# Patient Record
Sex: Female | Born: 1965 | Race: Black or African American | Hispanic: No | Marital: Single | Smoking: Never smoker
Health system: Southern US, Community
[De-identification: ages and names within clinical notes are randomized; demographics above are authoritative.]

## PROBLEM LIST (undated history)

## (undated) DIAGNOSIS — G43909 Migraine, unspecified, not intractable, without status migrainosus: Secondary | ICD-10-CM

## (undated) DIAGNOSIS — E7800 Pure hypercholesterolemia, unspecified: Secondary | ICD-10-CM

## (undated) DIAGNOSIS — F329 Major depressive disorder, single episode, unspecified: Secondary | ICD-10-CM

## (undated) DIAGNOSIS — J45909 Unspecified asthma, uncomplicated: Secondary | ICD-10-CM

## (undated) DIAGNOSIS — I341 Nonrheumatic mitral (valve) prolapse: Secondary | ICD-10-CM

## (undated) DIAGNOSIS — J302 Other seasonal allergic rhinitis: Secondary | ICD-10-CM

## (undated) DIAGNOSIS — F419 Anxiety disorder, unspecified: Secondary | ICD-10-CM

## (undated) DIAGNOSIS — I10 Essential (primary) hypertension: Secondary | ICD-10-CM

## (undated) DIAGNOSIS — E119 Type 2 diabetes mellitus without complications: Secondary | ICD-10-CM

## (undated) DIAGNOSIS — D573 Sickle-cell trait: Secondary | ICD-10-CM

## (undated) DIAGNOSIS — F32A Depression, unspecified: Secondary | ICD-10-CM

## (undated) HISTORY — DX: Sickle-cell trait: D57.3

## (undated) HISTORY — PX: HAMMER TOE SURGERY: SHX385

## (undated) HISTORY — DX: Anxiety disorder, unspecified: F41.9

## (undated) HISTORY — DX: Pure hypercholesterolemia, unspecified: E78.00

## (undated) HISTORY — PX: TUBAL LIGATION: SHX77

## (undated) HISTORY — PX: EYE SURGERY: SHX253

## (undated) HISTORY — DX: Unspecified asthma, uncomplicated: J45.909

## (undated) HISTORY — DX: Other seasonal allergic rhinitis: J30.2

## (undated) HISTORY — DX: Major depressive disorder, single episode, unspecified: F32.9

## (undated) HISTORY — DX: Essential (primary) hypertension: I10

## (undated) HISTORY — DX: Migraine, unspecified, not intractable, without status migrainosus: G43.909

## (undated) HISTORY — PX: TONSILLECTOMY: SUR1361

## (undated) HISTORY — PX: OVARIAN CYST REMOVAL: SHX89

## (undated) HISTORY — DX: Nonrheumatic mitral (valve) prolapse: I34.1

## (undated) HISTORY — DX: Type 2 diabetes mellitus without complications: E11.9

## (undated) HISTORY — DX: Depression, unspecified: F32.A

---

## 1993-12-09 HISTORY — PX: OTHER SURGICAL HISTORY: SHX169

## 1999-10-04 ENCOUNTER — Ambulatory Visit (HOSPITAL_BASED_OUTPATIENT_CLINIC_OR_DEPARTMENT_OTHER): Admission: RE | Admit: 1999-10-04 | Discharge: 1999-10-04 | Payer: Self-pay | Admitting: Plastic Surgery

## 2000-04-23 ENCOUNTER — Encounter: Admission: RE | Admit: 2000-04-23 | Discharge: 2000-04-23 | Payer: Self-pay | Admitting: Family Medicine

## 2000-04-23 ENCOUNTER — Encounter: Payer: Self-pay | Admitting: Family Medicine

## 2003-04-08 ENCOUNTER — Other Ambulatory Visit: Admission: RE | Admit: 2003-04-08 | Discharge: 2003-04-08 | Payer: Self-pay | Admitting: Family Medicine

## 2003-04-28 ENCOUNTER — Other Ambulatory Visit: Admission: RE | Admit: 2003-04-28 | Discharge: 2003-04-28 | Payer: Self-pay | Admitting: Obstetrics and Gynecology

## 2004-04-17 ENCOUNTER — Other Ambulatory Visit: Admission: RE | Admit: 2004-04-17 | Discharge: 2004-04-17 | Payer: Self-pay | Admitting: Family Medicine

## 2005-04-17 ENCOUNTER — Other Ambulatory Visit: Admission: RE | Admit: 2005-04-17 | Discharge: 2005-04-17 | Payer: Self-pay | Admitting: Family Medicine

## 2005-04-17 ENCOUNTER — Ambulatory Visit: Payer: Self-pay | Admitting: Family Medicine

## 2005-07-02 ENCOUNTER — Ambulatory Visit: Payer: Self-pay | Admitting: Internal Medicine

## 2006-06-10 ENCOUNTER — Other Ambulatory Visit: Admission: RE | Admit: 2006-06-10 | Discharge: 2006-06-10 | Payer: Self-pay | Admitting: Internal Medicine

## 2006-06-10 ENCOUNTER — Ambulatory Visit: Payer: Self-pay | Admitting: Internal Medicine

## 2006-06-10 ENCOUNTER — Encounter: Payer: Self-pay | Admitting: Internal Medicine

## 2006-12-01 ENCOUNTER — Ambulatory Visit: Payer: Self-pay | Admitting: Internal Medicine

## 2006-12-01 LAB — CONVERTED CEMR LAB
Chol/HDL Ratio, serum: 4.5
Cholesterol: 166 mg/dL (ref 0–200)
HCT: 33.9 % — ABNORMAL LOW (ref 36.0–46.0)
HDL: 37.1 mg/dL — ABNORMAL LOW (ref >39.0)
Hemoglobin: 11.2 g/dL — ABNORMAL LOW (ref 12.0–15.0)
LDL Cholesterol: 107 mg/dL — ABNORMAL HIGH (ref 0–99)
MCHC: 32.9 g/dL (ref 30.0–36.0)
MCV: 81.6 fL (ref 78.0–100.0)
Platelets: 313 10*3/uL (ref 150–400)
RBC: 4.16 M/uL (ref 3.87–5.11)
RDW: 16.1 % — ABNORMAL HIGH (ref 11.5–14.6)
Triglyceride fasting, serum: 108 mg/dL (ref 0–149)
VLDL: 22 mg/dL (ref 0–40)
WBC: 4.4 10*3/uL — ABNORMAL LOW (ref 4.5–10.5)

## 2007-05-18 ENCOUNTER — Telehealth: Payer: Self-pay | Admitting: Internal Medicine

## 2007-05-26 ENCOUNTER — Telehealth: Payer: Self-pay | Admitting: Internal Medicine

## 2007-05-29 ENCOUNTER — Encounter: Payer: Self-pay | Admitting: Internal Medicine

## 2007-06-01 ENCOUNTER — Encounter: Payer: Self-pay | Admitting: Internal Medicine

## 2007-06-24 ENCOUNTER — Encounter: Payer: Self-pay | Admitting: Internal Medicine

## 2007-06-24 ENCOUNTER — Other Ambulatory Visit: Admission: RE | Admit: 2007-06-24 | Discharge: 2007-06-24 | Payer: Self-pay | Admitting: Internal Medicine

## 2007-06-24 ENCOUNTER — Ambulatory Visit: Payer: Self-pay | Admitting: Internal Medicine

## 2007-06-24 DIAGNOSIS — S0280XA Fracture of other specified skull and facial bones, unspecified side, initial encounter for closed fracture: Secondary | ICD-10-CM

## 2007-06-24 DIAGNOSIS — G43009 Migraine without aura, not intractable, without status migrainosus: Secondary | ICD-10-CM

## 2007-06-24 DIAGNOSIS — D573 Sickle-cell trait: Secondary | ICD-10-CM

## 2007-06-24 DIAGNOSIS — E7210 Disorders of sulfur-bearing amino-acid metabolism, unspecified: Secondary | ICD-10-CM | POA: Insufficient documentation

## 2007-06-27 LAB — CONVERTED CEMR LAB
Folate: 15.4 ng/mL
Hemoglobin: 12.6 g/dL (ref 12.0–15.0)
Homocysteine: 8.3 micromoles/L (ref 5.00–13.90)
Iron: 169 ug/dL — ABNORMAL HIGH (ref 42–145)
Transferrin: 277.5 mg/dL (ref >=212.0)

## 2007-06-29 ENCOUNTER — Encounter (INDEPENDENT_AMBULATORY_CARE_PROVIDER_SITE_OTHER): Payer: Self-pay | Admitting: *Deleted

## 2008-06-17 ENCOUNTER — Encounter: Payer: Self-pay | Admitting: Internal Medicine

## 2008-06-27 ENCOUNTER — Encounter (INDEPENDENT_AMBULATORY_CARE_PROVIDER_SITE_OTHER): Payer: Self-pay | Admitting: *Deleted

## 2011-12-16 ENCOUNTER — Ambulatory Visit (INDEPENDENT_AMBULATORY_CARE_PROVIDER_SITE_OTHER): Payer: BC Managed Care – PPO

## 2011-12-16 DIAGNOSIS — N390 Urinary tract infection, site not specified: Secondary | ICD-10-CM

## 2012-01-10 ENCOUNTER — Other Ambulatory Visit: Payer: Self-pay | Admitting: Family Medicine

## 2012-01-16 ENCOUNTER — Other Ambulatory Visit: Payer: Self-pay | Admitting: Family Medicine

## 2012-01-17 ENCOUNTER — Telehealth: Payer: Self-pay

## 2012-01-17 NOTE — Telephone Encounter (Signed)
PT LOOKING FOR CLONOPIN MED REFILL FOR [facility]

## 2012-01-19 MED ORDER — CLONAZEPAM 0.5 MG PO TABS: ORAL_TABLET | ORAL | Status: DC

## 2012-01-19 NOTE — Telephone Encounter (Signed)
Please call in to pharmacy.  Please notify patient. csj

## 2012-01-19 NOTE — Telephone Encounter (Signed)
RX CALLED INTO PHARMACY AND LMOM NOTIFYING PT 

## 2012-02-28 ENCOUNTER — Other Ambulatory Visit: Payer: Self-pay | Admitting: Physician Assistant

## 2012-04-27 ENCOUNTER — Other Ambulatory Visit: Payer: Self-pay

## 2012-04-27 MED ORDER — CLONAZEPAM 0.5 MG PO TABS: ORAL_TABLET | ORAL | Status: DC

## 2012-06-19 ENCOUNTER — Other Ambulatory Visit: Payer: Self-pay | Admitting: Physician Assistant

## 2012-06-21 ENCOUNTER — Other Ambulatory Visit: Payer: Self-pay

## 2012-08-10 ENCOUNTER — Ambulatory Visit (INDEPENDENT_AMBULATORY_CARE_PROVIDER_SITE_OTHER): Payer: BC Managed Care – PPO | Admitting: Physician Assistant

## 2012-08-10 VITALS — BP 113/75 | HR 82 | Temp 98.2°F | Resp 18 | Ht 70.0 in | Wt 197.8 lb

## 2012-08-10 DIAGNOSIS — F419 Anxiety disorder, unspecified: Secondary | ICD-10-CM

## 2012-08-10 DIAGNOSIS — F411 Generalized anxiety disorder: Secondary | ICD-10-CM

## 2012-08-10 DIAGNOSIS — Z0000 Encounter for general adult medical examination without abnormal findings: Secondary | ICD-10-CM

## 2012-08-10 LAB — COMPREHENSIVE METABOLIC PANEL
ALT: 12 U/L (ref 0–35)
AST: 16 U/L (ref 0–37)
Albumin: 4.7 g/dL (ref 3.5–5.2)
Alkaline Phosphatase: 39 U/L (ref 39–117)
BUN: 8 mg/dL (ref 6–23)
CO2: 24 mEq/L (ref 19–32)
Calcium: 9.6 mg/dL (ref 8.4–10.5)
Chloride: 107 mEq/L (ref 96–112)
Creat: 0.76 mg/dL (ref 0.50–1.10)
Glucose, Bld: 94 mg/dL (ref 70–99)
Potassium: 4.4 mEq/L (ref 3.5–5.3)
Sodium: 139 mEq/L (ref 135–145)
Total Bilirubin: 0.5 mg/dL (ref 0.3–1.2)
Total Protein: 7.6 g/dL (ref 6.0–8.3)

## 2012-08-10 LAB — POCT UA - MICROSCOPIC ONLY
Bacteria, U Microscopic: NEGATIVE
Casts, Ur, LPF, POC: NEGATIVE
Crystals, Ur, HPF, POC: NEGATIVE
Yeast, UA: NEGATIVE

## 2012-08-10 LAB — POCT URINALYSIS DIPSTICK
Bilirubin, UA: NEGATIVE
Blood, UA: NEGATIVE
Glucose, UA: NEGATIVE
Ketones, UA: NEGATIVE
Leukocytes, UA: NEGATIVE
Nitrite, UA: NEGATIVE
Protein, UA: NEGATIVE
Spec Grav, UA: 1.02
Urobilinogen, UA: 0.2
pH, UA: 5

## 2012-08-10 LAB — POCT WET PREP WITH KOH
KOH Prep POC: NEGATIVE
RBC Wet Prep HPF POC: NEGATIVE
Trichomonas, UA: NEGATIVE
WBC Wet Prep HPF POC: NEGATIVE
Yeast Wet Prep HPF POC: NEGATIVE

## 2012-08-10 LAB — LIPID PANEL
Cholesterol: 163 mg/dL (ref 0–200)
HDL: 29 mg/dL — ABNORMAL LOW (ref >39)
LDL Cholesterol: 92 mg/dL (ref 0–99)
Total CHOL/HDL Ratio: 5.6 Ratio
Triglycerides: 208 mg/dL — ABNORMAL HIGH (ref <150)
VLDL: 42 mg/dL — ABNORMAL HIGH (ref 0–40)

## 2012-08-10 LAB — POCT CBC
Granulocyte percent: 63 %G (ref 37–80)
HCT, POC: 38.6 % (ref 37.7–47.9)
Hemoglobin: 11.9 g/dL — AB (ref 12.2–16.2)
Lymph, poc: 1.6 (ref 0.6–3.4)
MCH, POC: 25.9 pg — AB (ref 27–31.2)
MCHC: 30.8 g/dL — AB (ref 31.8–35.4)
MCV: 84.2 fL (ref 80–97)
MID (cbc): 0.4 (ref 0–0.9)
MPV: 7.7 fL (ref 0–99.8)
POC Granulocyte: 3.4 (ref 2–6.9)
POC LYMPH PERCENT: 29 %L (ref 10–50)
POC MID %: 8 %M (ref 0–12)
Platelet Count, POC: 345 10*3/uL (ref 142–424)
RBC: 4.59 M/uL (ref 4.04–5.48)
RDW, POC: 14.7 %
WBC: 5.4 10*3/uL (ref 4.6–10.2)

## 2012-08-10 LAB — TSH: TSH: 0.777 u[IU]/mL (ref 0.350–4.500)

## 2012-08-10 MED ORDER — CLONAZEPAM 0.5 MG PO TABS: 0.2500 mg | ORAL_TABLET | Freq: Two times a day (BID) | ORAL | Status: DC | PRN

## 2012-08-10 NOTE — Progress Notes (Signed)
  Subjective:    Patient ID: Kendra Saunders, female    DOB: January 02, 1966, 46 y.o.   MRN: [id number]  HPI 46 year old female presents for complete physical. Her last physical was 1 year ago. She has no known medical problems and is doing well. Does need a refill on her clonazepam.  Does complain of some urinary frequency x 2 days. States she is prone to UTI's and wants to be checked today. Also complains of frequent yeast infections but denies any specific symptoms today. Had mammogram 06/2012 that was normal.     Review of Systems  All other systems reviewed and are negative.       Objective:   Physical Exam  Vitals reviewed. Constitutional: She is oriented to person, place, and time. She appears well-developed and well-nourished. No distress.  HENT:  Head: Normocephalic and atraumatic.  Right Ear: Hearing, tympanic membrane, external ear and ear canal normal.  Left Ear: Hearing, tympanic membrane, external ear and ear canal normal.  Nose: Nose normal.  Mouth/Throat: Uvula is midline, oropharynx is clear and moist and mucous membranes are normal. No oropharyngeal exudate.  Eyes: Conjunctivae, EOM and lids are normal. Pupils are equal, round, and reactive to light.  Neck: Neck supple. No thyromegaly present.  Cardiovascular: Normal rate, regular rhythm and normal heart sounds.   Pulmonary/Chest: Effort normal and breath sounds normal.  Abdominal: Soft. Bowel sounds are normal. There is no hepatosplenomegaly. There is no tenderness.  Genitourinary: Vagina normal. No breast swelling, tenderness, discharge or bleeding. Pelvic exam was performed with patient supine. There is no rash, tenderness, lesion or injury on the right labia. There is no rash, tenderness, lesion or injury on the left labia.  Musculoskeletal: Normal range of motion.       Right shoulder: Normal.       Left shoulder: Normal.       Right knee: Normal.       Left knee: Normal.  Lymphadenopathy:    She has no cervical  adenopathy.       Right: No inguinal adenopathy present.       Left: No inguinal adenopathy present.  Neurological: She is alert and oriented to person, place, and time. No cranial nerve deficit.  Skin: Skin is warm and dry. She is not diaphoretic.  Psychiatric: She has a normal mood and affect. Her behavior is normal. Judgment and thought content normal.          Assessment & Plan:   1. Routine general medical examination at a health care facility  POCT CBC, POCT Wet Prep with KOH, POCT urinalysis dipstick, POCT UA - Microscopic Only, Comprehensive metabolic panel, Lipid panel, TSH  Slightly anemic, patient has taken OTC iron in the past and will restart.  Will await labs.   Refilled clonazepam.   No evidence of UTI or yeast.  Follow up prn.

## 2012-08-11 ENCOUNTER — Encounter: Payer: BC Managed Care – PPO | Admitting: Family Medicine

## 2012-08-12 LAB — PAP IG, CT-NG, RFX HPV ASCU
Chlamydia Probe Amp: NEGATIVE
GC Probe Amp: NEGATIVE

## 2012-09-10 ENCOUNTER — Other Ambulatory Visit: Payer: Self-pay | Admitting: Physician Assistant

## 2012-09-10 ENCOUNTER — Other Ambulatory Visit: Payer: Self-pay | Admitting: *Deleted

## 2012-10-09 ENCOUNTER — Other Ambulatory Visit: Payer: Self-pay | Admitting: Physician Assistant

## 2012-11-09 ENCOUNTER — Other Ambulatory Visit: Payer: Self-pay | Admitting: Physician Assistant

## 2012-11-09 ENCOUNTER — Telehealth: Payer: Self-pay | Admitting: Family Medicine

## 2012-11-09 NOTE — Telephone Encounter (Signed)
Called in rx clonazepam 0.5mg  1/2-1 tab po bid prn anxiety #45 0 refills [facility] [address].

## 2012-12-11 ENCOUNTER — Telehealth: Payer: Self-pay | Admitting: *Deleted

## 2012-12-11 NOTE — Telephone Encounter (Signed)
Pharmacy requesting refill on clonazepam 0.5mg .  Last filled on 11/09/12

## 2012-12-12 MED ORDER — CLONAZEPAM 0.5 MG PO TABS: 0.2500 mg | ORAL_TABLET | Freq: Two times a day (BID) | ORAL | Status: DC | PRN

## 2012-12-12 NOTE — Telephone Encounter (Signed)
Rx done and ready to be faxed 

## 2013-01-12 ENCOUNTER — Telehealth: Payer: Self-pay | Admitting: *Deleted

## 2013-01-12 NOTE — Telephone Encounter (Signed)
Pharmacy requesting refill on clonzepam 0.5mg .  Last fill on 12/13/12

## 2013-01-13 MED ORDER — CLONAZEPAM 0.5 MG PO TABS: 0.2500 mg | ORAL_TABLET | Freq: Two times a day (BID) | ORAL | Status: DC | PRN

## 2013-01-13 NOTE — Telephone Encounter (Signed)
Rx done and ready for pickup. She will need an office visit before this runs out.

## 2013-01-13 NOTE — Telephone Encounter (Signed)
Faxed Rx to pharmacy and called pt to notify her that she is due for OV before next RF. Transferred pt to 104 to set up appt.

## 2013-01-27 ENCOUNTER — Ambulatory Visit (INDEPENDENT_AMBULATORY_CARE_PROVIDER_SITE_OTHER): Payer: BC Managed Care – PPO | Admitting: Emergency Medicine

## 2013-01-27 ENCOUNTER — Other Ambulatory Visit: Payer: Self-pay | Admitting: *Deleted

## 2013-01-27 VITALS — BP 130/83 | HR 93 | Temp 98.3°F | Resp 16 | Ht 69.0 in | Wt 196.0 lb

## 2013-01-27 DIAGNOSIS — N3000 Acute cystitis without hematuria: Secondary | ICD-10-CM

## 2013-01-27 DIAGNOSIS — F411 Generalized anxiety disorder: Secondary | ICD-10-CM | POA: Insufficient documentation

## 2013-01-27 DIAGNOSIS — IMO0001 Reserved for inherently not codable concepts without codable children: Secondary | ICD-10-CM

## 2013-01-27 LAB — POCT UA - MICROSCOPIC ONLY
Casts, Ur, LPF, POC: NEGATIVE
Crystals, Ur, HPF, POC: NEGATIVE
Yeast, UA: NEGATIVE

## 2013-01-27 LAB — POCT URINALYSIS DIPSTICK
Bilirubin, UA: NEGATIVE
Nitrite, UA: NEGATIVE
Protein, UA: NEGATIVE
pH, UA: 5.5

## 2013-01-27 MED ORDER — FLUCONAZOLE 150 MG PO TABS: 150.0000 mg | ORAL_TABLET | Freq: Once | ORAL | Status: DC

## 2013-01-27 MED ORDER — LORAZEPAM 1 MG PO TABS: 1.0000 mg | ORAL_TABLET | Freq: Three times a day (TID) | ORAL | Status: DC | PRN

## 2013-01-27 MED ORDER — PAROXETINE HCL 20 MG PO TABS: 20.0000 mg | ORAL_TABLET | ORAL | Status: DC

## 2013-01-27 MED ORDER — CIPROFLOXACIN HCL 500 MG PO TABS: 500.0000 mg | ORAL_TABLET | Freq: Two times a day (BID) | ORAL | Status: DC

## 2013-01-27 NOTE — Patient Instructions (Signed)

## 2013-01-27 NOTE — Progress Notes (Signed)
[facility] [address], [place] [address] [phone]  Date:  01/27/2013   Name:  Kendra Saunders   DOB:  05/01/1966   MRN:  [id number]  PCP:  Tally Due, MD    Chief Complaint: Medication Refill and Urinary Frequency   History of Present Illness:  Kendra Saunders is a 47 y.o. very pleasant female patient who presents with the following:  History of anxiety disorder. Symptoms no longer well controlled with clonazepam.  Sleeping ok.  Has dysuria and urgency and nocturia.  No fever or chills or back pain.  No abdominal pain.  Patient Active Problem List  Diagnosis  . HOMOCYSTINEMIA  . SICKLE CELL TRAIT  . COMMON MIGRAINE  . FX, FACIAL BONE NEC, CLOSED  . Anxiety state, unspecified    Past Medical History  Diagnosis Date  . Asthma   . Anxiety     Past Surgical History  Procedure Laterality Date  . Eye surgery      History  Substance Use Topics  . Smoking status: Never Smoker   . Smokeless tobacco: Not on file  . Alcohol Use: Not on file    Family History  Problem Relation Age of Onset  . Cancer Mother     breast cancer  . Cancer Father     brain  . Heart disease Brother     heart attack  . Cancer Paternal Grandfather     breast cancer    No Known Allergies  Medication list has been reviewed and updated.  Current Outpatient Prescriptions on File Prior to Visit  Medication Sig Dispense Refill  . clonazePAM (KLONOPIN) 0.5 MG tablet Take 0.5-1 tablets (0.25-0.5 mg total) by mouth 2 (two) times daily as needed for anxiety.  45 tablet  0   No current facility-administered medications on file prior to visit.    Review of Systems:  As per HPI, otherwise negative.    Physical Examination: Filed Vitals:   01/27/13 1752  BP: 130/83  Pulse: 93  Temp: 98.3 F (36.8 C)  Resp: 16   Filed Vitals:   01/27/13 1752  Height: 5\' 9"  (1.753 m)  Weight: 196 lb (88.905 kg)   Body mass index is 28.93 kg/(m^2). Ideal Body  Weight: Weight in (lb) to have BMI = 25: 168.9  GEN: WDWN, NAD, Non-toxic, A & O x 3 HEENT: Atraumatic, Normocephalic. Neck supple. No masses, No LAD. Ears and Nose: No external deformity. CV: RRR, No M/G/R. No JVD. No thrill. No extra heart sounds. PULM: CTA B, no wheezes, crackles, rhonchi. No retractions. No resp. distress. No accessory muscle use. ABD: S, NT, ND, +BS. No rebound. No HSM. EXTR: No c/c/e NEURO Normal gait.  PSYCH: Normally interactive. Conversant. Not depressed or anxious appearing.  Calm demeanor.    Assessment and Plan: Cystitis Anxiety disorder Ativan paxil Follow up in 2 months  [name], MD  Results for orders placed in visit on 01/27/13  POCT URINALYSIS DIPSTICK      Result Value Range   Color, UA yellow     Clarity, UA cloudy     Glucose, UA neg     Bilirubin, UA neg     Ketones, UA neg     Spec Grav, UA 1.020     Blood, UA trace-lysed     pH, UA 5.5     Protein, UA neg     Urobilinogen, UA 0.2     Nitrite, UA neg  Leukocytes, UA small (1+)    POCT UA - MICROSCOPIC ONLY      Result Value Range   WBC, Ur, HPF, POC 1-5     RBC, urine, microscopic 1-3     Bacteria, U Microscopic 2+     Mucus, UA small     Epithelial cells, urine per micros tntc     Crystals, Ur, HPF, POC neg     Casts, Ur, LPF, POC neg     Yeast, UA neg

## 2013-02-12 ENCOUNTER — Ambulatory Visit: Payer: BC Managed Care – PPO | Admitting: Family Medicine

## 2013-04-22 ENCOUNTER — Other Ambulatory Visit: Payer: Self-pay | Admitting: Emergency Medicine

## 2013-04-23 ENCOUNTER — Other Ambulatory Visit: Payer: Self-pay | Admitting: Family Medicine

## 2013-04-25 ENCOUNTER — Ambulatory Visit (INDEPENDENT_AMBULATORY_CARE_PROVIDER_SITE_OTHER): Payer: BC Managed Care – PPO | Admitting: Physician Assistant

## 2013-04-25 VITALS — BP 132/78 | HR 94 | Temp 98.2°F | Resp 16 | Ht 69.5 in | Wt 188.8 lb

## 2013-04-25 DIAGNOSIS — R309 Painful micturition, unspecified: Secondary | ICD-10-CM

## 2013-04-25 DIAGNOSIS — R300 Dysuria: Secondary | ICD-10-CM

## 2013-04-25 DIAGNOSIS — F411 Generalized anxiety disorder: Secondary | ICD-10-CM

## 2013-04-25 LAB — POCT URINALYSIS DIPSTICK
Bilirubin, UA: NEGATIVE
Glucose, UA: NEGATIVE
Spec Grav, UA: 1.02
Urobilinogen, UA: 0.2

## 2013-04-25 LAB — POCT UA - MICROSCOPIC ONLY
Casts, Ur, LPF, POC: NEGATIVE
Mucus, UA: NEGATIVE

## 2013-04-25 MED ORDER — SULFAMETHOXAZOLE-TRIMETHOPRIM 800-160 MG PO TABS: 1.0000 | ORAL_TABLET | Freq: Two times a day (BID) | ORAL | Status: DC

## 2013-04-25 MED ORDER — PAROXETINE HCL 20 MG PO TABS: 20.0000 mg | ORAL_TABLET | ORAL | Status: DC

## 2013-04-25 NOTE — Progress Notes (Signed)
  Subjective:    Patient ID: Kendra Saunders, female    DOB: [birth year], 47 y.o.   MRN: [id number]  HPI This 47 y.o. female presents for evaluation of UTI.  Recurrent infections since started teaching in 2006.  Doesn't have the flexibility to urinate regularly, and so doesn't drink much water during the day. This episode began 04/23/2013.  Pressure, urge to urinate, increased frequency, foul odor (like garbage.) No burning.  Blood present, but patient is also menstruating. Additionally, she requests a refill of paroxetine.  Since starting it, she rarely needs the lorazepam.  Past medical history, surgical history, family history, social history and problem list reviewed.   Review of Systems No fever, chills, nausea, vomiting, diarrhea.  No atypical vaginal discharge.    Objective:   Physical Exam  Constitutional: She is oriented to person, place, and time. Vital signs are normal. She appears well-developed and well-nourished. No distress.  HENT:  Head: Normocephalic and atraumatic.  Cardiovascular: Normal rate, regular rhythm and normal heart sounds.   Pulmonary/Chest: Effort normal and breath sounds normal.  Abdominal: Soft. Normal appearance and bowel sounds are normal. She exhibits no distension and no mass. There is no hepatosplenomegaly. There is no tenderness. There is no rigidity, no rebound, no guarding, no CVA tenderness, no tenderness at McBurney's point and negative Murphy's sign. No hernia.  Musculoskeletal: Normal range of motion.       Lumbar back: Normal.  Neurological: She is alert and oriented to person, place, and time.  Skin: Skin is warm and dry. No rash noted. She is not diaphoretic. No pallor.  Psychiatric: She has a normal mood and affect. Her speech is normal and behavior is normal. Judgment normal.   Results for orders placed in visit on 04/25/13  POCT UA - MICROSCOPIC ONLY      Result Value Range   WBC, Ur, HPF, POC 3-5     RBC, urine, microscopic 10-15     Bacteria, U Microscopic 2+     Mucus, UA neg     Epithelial cells, urine per micros 1-3     Crystals, Ur, HPF, POC neg     Casts, Ur, LPF, POC neg     Yeast, UA neg    POCT URINALYSIS DIPSTICK      Result Value Range   Color, UA orange     Clarity, UA cloudy     Glucose, UA neg     Bilirubin, UA neg     Ketones, UA neg     Spec Grav, UA 1.020     Blood, UA large     pH, UA 7.0     Protein, UA 100     Urobilinogen, UA 0.2     Nitrite, UA neg     Leukocytes, UA Negative        Assessment & Plan:  Painful urination - Plan: POCT UA - Microscopic Only, POCT urinalysis dipstick, Urine culture, sulfamethoxazole-trimethoprim (BACTRIM DS,SEPTRA DS) 800-160 MG per tablet  Anxiety state, unspecified - stable.  Continue current treatment. Plan: PARoxetine (PAXIL) 20 MG tablet  Fernande Bras, PA-C Physician Assistant-Certified [facility]

## 2013-04-25 NOTE — Patient Instructions (Signed)
Get plenty of rest and drink at least 64 ounces of water daily. 

## 2013-04-27 LAB — URINE CULTURE: Colony Count: 75000

## 2013-05-04 ENCOUNTER — Telehealth: Payer: Self-pay

## 2013-05-04 MED ORDER — FLUCONAZOLE 150 MG PO TABS: 150.0000 mg | ORAL_TABLET | Freq: Once | ORAL | Status: DC

## 2013-05-04 NOTE — Telephone Encounter (Signed)
PT WAS GIVEN ANTIBIOTICS AND NOW HAVE A YEAST INFECTION. WOULD LIKE TO HAVE A DIFLUCAN CALLED IN. PLEASE CALL PT AT AND LEAVE MESSAGE AT [phone]    [facility] [address]

## 2013-05-04 NOTE — Telephone Encounter (Signed)
Sent in called patient.

## 2013-09-18 ENCOUNTER — Ambulatory Visit (INDEPENDENT_AMBULATORY_CARE_PROVIDER_SITE_OTHER): Payer: BC Managed Care – PPO | Admitting: Family Medicine

## 2013-09-18 VITALS — BP 130/78 | HR 80 | Temp 98.1°F | Resp 16 | Ht 69.5 in | Wt 196.6 lb

## 2013-09-18 DIAGNOSIS — Z23 Encounter for immunization: Secondary | ICD-10-CM

## 2013-09-18 DIAGNOSIS — Z0000 Encounter for general adult medical examination without abnormal findings: Secondary | ICD-10-CM

## 2013-09-18 DIAGNOSIS — F411 Generalized anxiety disorder: Secondary | ICD-10-CM

## 2013-09-18 LAB — POCT URINALYSIS DIPSTICK
Bilirubin, UA: NEGATIVE
Glucose, UA: NEGATIVE
Ketones, UA: NEGATIVE
Nitrite, UA: POSITIVE
Urobilinogen, UA: 0.2
pH, UA: 5

## 2013-09-18 LAB — POCT UA - MICROSCOPIC ONLY
Crystals, Ur, HPF, POC: NEGATIVE
RBC, urine, microscopic: NEGATIVE
Yeast, UA: NEGATIVE

## 2013-09-18 MED ORDER — PAROXETINE HCL 20 MG PO TABS: 20.0000 mg | ORAL_TABLET | ORAL | Status: DC

## 2013-09-18 NOTE — Progress Notes (Addendum)
This chart was scribed for Norberto Sorenson, MD, by Yevette Edwards, ED Scribe. The patient's care was started at 3:30 PM.  Subjective:    Patient ID: Kendra Saunders, female    DOB: 04-14-1966, 47 y.o.   MRN: [id number]  Chief Complaint  Patient presents with  . Annual Exam    w/o PAP  . Medication Refill    Paxil  . Immunizations    Influenza vaccine    HPI HPI Comments: Kendra Saunders is a 47 y.o. female who presents to the [facility] for a physical. She expresses concern with her weight - keeps increasing.  The pt ate breakfast prior to the appointment today.   The pt states that she has been experiencing increased urinary frequency. However, she reports that she also drinks a lot of water.   She would like an influenza vaccination and a refill of Paxil. She denies any depressive issues. The pt inquired into the different strengths available of Paxil.  The pt also inquired about the shingles vaccination; she declined it when told that the CDC does not recommend the shingles vaccination until she is 56 but can look into getting it earlier at the pharmacy if she would like.   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  She reports experiencing increased fatigue yesterday after taking the lorazepam.   She reports sleeping well at night and states that she typically falls asleep within three minutes.   She has a h/o sickle cell trait  The pt takes zinc, iron, and a pre-natal vitamin as supplements. She does not take Vit D or calcium, but she drinks milk and eats yogurt.   The pt inquired about the cholesterol levels which were checked last year. Her triglycerides were higher than desired and her HDL were lower than desired. The pt was informed that her cholesterol levels indicate an increased propensity for the future development of DM. The  pt was encouraged t to increase exercise, follow a Mediterranean diet, use flax seed, and/or use fish oil.   She had a pap smear in 2013; the pap smear was negative. The pt denies a h/o abnormal pap smears. Advised the pt that she does not need another pap smear until 2016. Pt reports that her menstruation is regular. She has a h/o tubal ligation.  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  Past Medical History  Diagnosis Date  . Asthma   . Anxiety    Current Outpatient Prescriptions on File Prior to Visit  Medication Sig Dispense Refill  . LORazepam (ATIVAN) 1 MG tablet Take 1 tablet (1 mg total) by mouth every 8 (eight) hours as needed for anxiety.  60 tablet  0  . PARoxetine (PAXIL) 20 MG tablet Take 1 tablet (20 mg total) by mouth every morning.  90 tablet  1  . fluconazole (DIFLUCAN) 150 MG tablet Take 1 tablet (150 mg total) by mouth once. Repeat if needed  2 tablet  0  . sulfamethoxazole-trimethoprim (BACTRIM DS,SEPTRA DS) 800-160 MG per tablet Take 1 tablet by mouth 2 (two) times daily.  10 tablet  0   No current facility-administered medications on file prior to visit.   No Known Allergies  Past Surgical History  Procedure Laterality Date  . Eye surgery     Family History  Problem  Relation Age of Onset  . Cancer Mother     breast cancer  . Cancer Father     brain  . Heart disease Brother     heart attack  . Cancer Paternal Grandfather     breast cancer  . Lupus Daughter     History   Social History  . Marital Status: Single    Spouse Name: n/a    Number of Children: 2  . Years of Education: Master's    Occupational History  . TEACHER [employer]   Social History Main Topics  . Smoking status: Never Smoker   . Smokeless tobacco: Never Used  . Alcohol Use: Yes     Comment: rarely  . Drug Use: No  . Sexual Activity: Yes    Partners: Male   Other Topics Concern  . Not on file   Social  History Narrative   Lives with her two children.    Vital Signs: BP 130/78  Pulse 80  Temp(Src) 98.1 F (36.7 C) (Oral)  Resp 16  Ht 5' 9.5" (1.765 m)  Wt 196 lb 9.6 oz (89.177 kg)  BMI 28.63 kg/m2  SpO2 100%  LMP 09/03/2013   Review of Systems  Constitutional: Negative for fever and chills.  Genitourinary: Positive for frequency.  Psychiatric/Behavioral: Positive for dysphoric mood. The patient is nervous/anxious.         depression.  All other systems reviewed and are negative.       Objective:   Physical Exam  Nursing note and vitals reviewed. Constitutional: She is oriented to person, place, and time. She appears well-developed and well-nourished. No distress.  HENT:  Head: Normocephalic and atraumatic.  Right Ear: Tympanic membrane, external ear and ear canal normal. Tympanic membrane is not injected, not erythematous and not bulging.  Left Ear: Tympanic membrane, external ear and ear canal normal. Tympanic membrane is not injected, not erythematous and not bulging.  Nose: Nose normal. No mucosal edema or rhinorrhea. No epistaxis.  No foreign bodies.  Mouth/Throat: Uvula is midline, oropharynx is clear and moist and mucous membranes are normal. No posterior oropharyngeal erythema.  Eyes: Conjunctivae and EOM are normal. Pupils are equal, round, and reactive to light. Right eye exhibits no discharge. Left eye exhibits no discharge. No scleral icterus.  Neck: Normal range of motion. Neck supple. No tracheal deviation present. No thyromegaly present.  Cardiovascular: Normal rate, regular rhythm, normal heart sounds and intact distal pulses.   No murmur heard. Pulses:      Dorsalis pedis pulses are 2+ on the right side, and 2+ on the left side.  Pulmonary/Chest: Effort normal and breath sounds normal. No respiratory distress. She has no wheezes. She has no rales.  Abdominal: Soft. Bowel sounds are normal. She exhibits no mass. There is no hepatosplenomegaly, splenomegaly  or hepatomegaly. There is no tenderness.  Musculoskeletal: Normal range of motion. She exhibits no edema.  No CVA tenderness. DP pulses +2 bilaterally.   Lymphadenopathy:       Head (right side): No submandibular, no preauricular and no posterior auricular adenopathy present.       Head (left side): No submandibular, no preauricular and no posterior auricular adenopathy present.    She has no cervical adenopathy.       Right: No supraclavicular adenopathy present.       Left: No supraclavicular adenopathy present.  Neurological: She is alert and oriented to person, place, and time. She has normal reflexes.  Skin: Skin is warm and  dry. She is not diaphoretic. No erythema.  Psychiatric: She has a normal mood and affect. Her behavior is normal.       Assessment & Plan:  Anxiety state, unspecified - Plan: PARoxetine (PAXIL) 20 MG tablet, POCT UA - Microscopic Only, POCT urinalysis dipstick, CBC, TSH, Comprehensive metabolic panel, Vit D  25 hydroxy (rtn osteoporosis monitoring), Flu Vaccine QUAD 36+ mos IM Pt informed that if she wanted - would be ok to titrate up paxil to 30mg  qd - she declines at this point but may reconsider in the future. Lorazepam not refilled since she still has a lot left from her prev rx.  HPL - not fasting today - work on diet and exercise, cons fish oil supp, recheck next yr  HM - mammogram this yr was normal. Next pap due 2016 since no h/o abnml. Flu shot today. Meds ordered this encounter  Medications  . PARoxetine (PAXIL) 20 MG tablet    Sig: Take 1 tablet (20 mg total) by mouth every morning.    Dispense:  90 tablet    Refill:  3    Order Specific Question:  Supervising Provider    Answer:  DOOLITTLE, ROBERT P [[id number]]

## 2013-09-18 NOTE — Patient Instructions (Signed)

## 2013-09-19 ENCOUNTER — Telehealth: Payer: Self-pay

## 2013-09-19 LAB — COMPREHENSIVE METABOLIC PANEL
Albumin: 4.6 g/dL (ref 3.5–5.2)
Alkaline Phosphatase: 44 U/L (ref 39–117)
CO2: 26 mEq/L (ref 19–32)
Glucose, Bld: 85 mg/dL (ref 70–99)
Potassium: 4.2 mEq/L (ref 3.5–5.3)
Sodium: 136 mEq/L (ref 135–145)
Total Protein: 7.8 g/dL (ref 6.0–8.3)

## 2013-09-19 LAB — CBC
HCT: 36.3 % (ref 36.0–46.0)
Hemoglobin: 12.8 g/dL (ref 12.0–15.0)
MCHC: 35.3 g/dL (ref 30.0–36.0)
RBC: 4.61 MIL/uL (ref 3.87–5.11)
RDW: 14.6 % (ref 11.5–15.5)
WBC: 6.6 10*3/uL (ref 4.0–10.5)

## 2013-09-19 LAB — TSH: TSH: 0.837 u[IU]/mL (ref 0.350–4.500)

## 2013-09-19 NOTE — Telephone Encounter (Signed)
PATIENT STATES SHE WAS IN TO SEE DR. SHAW ON Saturday. SHE GAVE A URINE SPECIMEN TO BE CHECKED FOR A POSSIBLE UTI. SHE WAS TOLD WE WOULD CALL HER WITH THE RESULTS, BUT SHE HAS NOT HEARD ANYTHING BACK YET. SHE WOULD LIKE TO GET STARTED ON MEDICATION AS SOON AS POSSIBLE IF SHE DOES HAVE A UTI. SHE WILL ALSO NEED TO GET DIFLUCAN CALLED INTO THE PHARMACY AS WELL. BEST PHONE [phone] (CELL)    PHARMACY CHOICE IS [facility] ON [address].    MBC

## 2013-09-20 MED ORDER — NITROFURANTOIN MONOHYD MACRO 100 MG PO CAPS: 100.0000 mg | ORAL_CAPSULE | Freq: Two times a day (BID) | ORAL | Status: DC

## 2013-09-20 MED ORDER — FLUCONAZOLE 150 MG PO TABS: 150.0000 mg | ORAL_TABLET | Freq: Once | ORAL | Status: DC

## 2013-09-20 NOTE — Telephone Encounter (Signed)
Patient wants to know U/A results please advise.

## 2013-09-20 NOTE — Telephone Encounter (Signed)
Thanks I have called her to advise, left message for her to call back

## 2013-09-20 NOTE — Telephone Encounter (Signed)
Does look like UTI. Rest of labs normal. Macrobid - bid x 1 wk sent to pharmacy along with a diflucan for pt to take when course is complete.

## 2013-09-21 ENCOUNTER — Encounter: Payer: Self-pay | Admitting: Family Medicine

## 2013-09-23 NOTE — Telephone Encounter (Signed)
Called her again, left another message for her.

## 2013-09-24 NOTE — Telephone Encounter (Signed)
Patient not returning my calls. Pharmacy indicates she did get the medication.

## 2013-11-20 ENCOUNTER — Other Ambulatory Visit: Payer: Self-pay | Admitting: Physician Assistant

## 2013-12-10 ENCOUNTER — Ambulatory Visit: Payer: BC Managed Care – PPO | Admitting: Family Medicine

## 2014-03-27 ENCOUNTER — Ambulatory Visit (INDEPENDENT_AMBULATORY_CARE_PROVIDER_SITE_OTHER): Payer: BC Managed Care – PPO | Admitting: Internal Medicine

## 2014-03-27 VITALS — BP 118/82 | HR 77 | Temp 99.3°F | Resp 16 | Ht 70.0 in | Wt 207.0 lb

## 2014-03-27 DIAGNOSIS — R300 Dysuria: Secondary | ICD-10-CM

## 2014-03-27 DIAGNOSIS — N390 Urinary tract infection, site not specified: Secondary | ICD-10-CM

## 2014-03-27 LAB — POCT URINALYSIS DIPSTICK
Bilirubin, UA: NEGATIVE
GLUCOSE UA: NEGATIVE
Ketones, UA: NEGATIVE
Nitrite, UA: POSITIVE
Protein, UA: NEGATIVE
RBC UA: NEGATIVE
Spec Grav, UA: 1.015
UROBILINOGEN UA: 0.2
pH, UA: 7

## 2014-03-27 LAB — POCT UA - MICROSCOPIC ONLY
CASTS, UR, LPF, POC: NEGATIVE
CRYSTALS, UR, HPF, POC: NEGATIVE
Mucus, UA: NEGATIVE
Yeast, UA: NEGATIVE

## 2014-03-27 MED ORDER — SULFAMETHOXAZOLE-TMP DS 800-160 MG PO TABS: 1.0000 | ORAL_TABLET | Freq: Two times a day (BID) | ORAL | Status: DC

## 2014-03-27 MED ORDER — FLUCONAZOLE 150 MG PO TABS: 150.0000 mg | ORAL_TABLET | Freq: Once | ORAL | Status: DC

## 2014-03-27 NOTE — Progress Notes (Signed)
Subjective:    Patient ID: Kendra Saunders, female    DOB: [birth year], 48 y.o.   MRN: [id number]  Urinary Tract Infection  Associated symptoms include frequency. Pertinent negatives include no urgency.   Chief Complaint  Patient presents with  . Urinary Tract Infection    symptoms started on Friday     This chart was scribed for Kendra Siaobert Jasmane Brockway, MD by Andrew Auaven Small, ED Scribe. This patient was seen in room 5 and the patient's care was started at 12:14 PM.  HPI Comments: Kendra Kendra Saunders is a 48 y.o. female who [facility] complaining of UTI symptoms onset 2 days. Pt last infection was one year ago. Pt reports frequency and nocturia. Pt denies dysuria, urgency, abdominal pain, back pain, and vaginal discharge. Pt reports last regular menstrual cycle was at the end of march.    Past Medical History  Diagnosis Date  . Asthma   . Anxiety    No Known Allergies Prior to Admission medications   Medication Sig Start Date End Date Taking? Authorizing Provider  LORazepam (ATIVAN) 1 MG tablet Take 1 tablet (1 mg total) by mouth every 8 (eight) hours as needed for anxiety. 01/27/13  Yes Phillips OdorJeffery Anderson, MD  PARoxetine (PAXIL) 20 MG tablet Take 1 tablet (20 mg total) by mouth every morning. 09/18/13  Yes Sherren MochaEva N Shaw, MD  fluconazole (DIFLUCAN) 150 MG tablet Take 1 tablet (150 mg total) by mouth once. 09/20/13   Sherren MochaEva N Shaw, MD  nitrofurantoin, macrocrystal-monohydrate, (MACROBID) 100 MG capsule Take 1 capsule (100 mg total) by mouth 2 (two) times daily. 09/20/13   Sherren MochaEva N Shaw, MD   Review of Systems  Gastrointestinal: Negative for abdominal pain.  Genitourinary: Positive for frequency. Negative for dysuria, urgency, vaginal discharge, difficulty urinating and menstrual problem.      Objective:   Physical Exam  Nursing note and vitals reviewed. Constitutional: She is oriented to person, place, and time. She appears well-developed and well-nourished. No  distress.  HENT:  Head: Normocephalic and atraumatic.  Eyes: EOM are normal.  Neck: Neck supple.  Cardiovascular: Normal rate.   Pulmonary/Chest: Effort normal. No respiratory distress.  Musculoskeletal: Normal range of motion.  Neurological: She is alert and oriented to person, place, and time.  Skin: Skin is warm and dry.  Psychiatric: She has a normal mood and affect. Her behavior is normal.    Results for orders placed in visit on 03/27/14  POCT URINALYSIS DIPSTICK      Result Value Ref Range   Color, UA yellow     Clarity, UA cloudy     Glucose, UA neg     Bilirubin, UA neg     Ketones, UA neg     Spec Grav, UA 1.015     Blood, UA neg     pH, UA 7.0     Protein, UA neg     Urobilinogen, UA 0.2     Nitrite, UA pos     Leukocytes, UA small (1+)    POCT UA - MICROSCOPIC ONLY      Result Value Ref Range   WBC, Ur, HPF, POC 3-12     RBC, urine, microscopic 0-2     Bacteria, U Microscopic 4+     Mucus, UA neg     Epithelial cells, urine per micros 0-3     Crystals, Ur, HPF, POC neg     Casts, Ur, LPF, POC neg     Yeast, UA  neg         Assessment & Plan:  I have completed the patient encounter in its entirety as documented by the scribe, with editing by me where necessary. Kendra Saunders, M.D.  Dysuria - Plan: POCT urinalysis dipstick, POCT UA - Microscopic Only  Urinary tract infection, site not specified  Meds ordered this encounter  Medications  . fluconazole (DIFLUCAN) 150 MG tablet    Sig: Take 1 tablet (150 mg total) by mouth once.    Dispense:  1 tablet    Refill:  0  . sulfamethoxazole-trimethoprim (BACTRIM DS) 800-160 MG per tablet    Sig: Take 1 tablet by mouth 2 (two) times daily.    Dispense:  10 tablet    Refill:  0

## 2014-06-13 ENCOUNTER — Ambulatory Visit (INDEPENDENT_AMBULATORY_CARE_PROVIDER_SITE_OTHER): Payer: BC Managed Care – PPO | Admitting: Family Medicine

## 2014-06-13 ENCOUNTER — Encounter: Payer: Self-pay | Admitting: Family Medicine

## 2014-06-13 ENCOUNTER — Other Ambulatory Visit: Payer: Self-pay | Admitting: Family Medicine

## 2014-06-13 VITALS — BP 118/76 | HR 87 | Temp 98.5°F | Resp 16 | Ht 69.5 in | Wt 208.8 lb

## 2014-06-13 DIAGNOSIS — R635 Abnormal weight gain: Secondary | ICD-10-CM

## 2014-06-13 DIAGNOSIS — N920 Excessive and frequent menstruation with regular cycle: Secondary | ICD-10-CM

## 2014-06-13 DIAGNOSIS — F411 Generalized anxiety disorder: Secondary | ICD-10-CM

## 2014-06-13 LAB — CBC WITH DIFFERENTIAL/PLATELET
BASOS ABS: 0 10*3/uL (ref 0.0–0.1)
Basophils Relative: 0 % (ref 0–1)
Eosinophils Absolute: 0.1 10*3/uL (ref 0.0–0.7)
Eosinophils Relative: 2 % (ref 0–5)
HCT: 32.2 % — ABNORMAL LOW (ref 36.0–46.0)
Hemoglobin: 11.1 g/dL — ABNORMAL LOW (ref 12.0–15.0)
LYMPHS PCT: 26 % (ref 12–46)
Lymphs Abs: 1.3 10*3/uL (ref 0.7–4.0)
MCH: 27.1 pg (ref 26.0–34.0)
MCHC: 34.5 g/dL (ref 30.0–36.0)
MCV: 78.5 fL (ref 78.0–100.0)
Monocytes Absolute: 0.6 10*3/uL (ref 0.1–1.0)
Monocytes Relative: 12 % (ref 3–12)
Neutro Abs: 3 10*3/uL (ref 1.7–7.7)
Neutrophils Relative %: 60 % (ref 43–77)
PLATELETS: 333 10*3/uL (ref 150–400)
RBC: 4.1 MIL/uL (ref 3.87–5.11)
RDW: 15.4 % (ref 11.5–15.5)
WBC: 5 10*3/uL (ref 4.0–10.5)

## 2014-06-13 MED ORDER — FLUOXETINE HCL 20 MG PO TABS: 20.0000 mg | ORAL_TABLET | Freq: Every day | ORAL | Status: DC
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
## 2014-06-14 LAB — TSH: TSH: 1.143 u[IU]/mL (ref 0.350–4.500)

## 2014-06-16 LAB — VITAMIN B12: Vitamin B-12: 593 pg/mL (ref 211–911)

## 2014-06-16 LAB — IRON AND TIBC
%SAT: 34 % (ref 20–55)
Iron: 118 ug/dL (ref 42–145)
TIBC: 352 ug/dL (ref 250–470)
UIBC: 234 ug/dL (ref 125–400)

## 2014-06-30 ENCOUNTER — Telehealth: Payer: Self-pay

## 2014-06-30 NOTE — Telephone Encounter (Signed)
Left a message with patient regarding release form we received via fax. Form states to send records from 06/09/14 to 06/09/14. [facility] Advised patient that she has a visit from 06/13/14 but not 06/09/14 and to please call back if she meant to request records from that day instead. Placed in pending box for now.

## 2014-07-06 LAB — HM MAMMOGRAPHY: HM Mammogram: NEGATIVE

## 2014-07-15 ENCOUNTER — Other Ambulatory Visit: Payer: Self-pay

## 2014-07-18 ENCOUNTER — Encounter: Payer: Self-pay | Admitting: Family Medicine

## 2014-07-18 ENCOUNTER — Ambulatory Visit (INDEPENDENT_AMBULATORY_CARE_PROVIDER_SITE_OTHER): Payer: BC Managed Care – PPO | Admitting: Family Medicine

## 2014-07-18 VITALS — BP 125/78 | HR 82 | Temp 98.8°F | Resp 16 | Ht 69.5 in | Wt 205.2 lb

## 2014-07-18 DIAGNOSIS — N920 Excessive and frequent menstruation with regular cycle: Secondary | ICD-10-CM

## 2014-07-18 DIAGNOSIS — D573 Sickle-cell trait: Secondary | ICD-10-CM

## 2014-07-18 DIAGNOSIS — E663 Overweight: Secondary | ICD-10-CM

## 2014-07-18 DIAGNOSIS — F411 Generalized anxiety disorder: Secondary | ICD-10-CM

## 2014-07-18 DIAGNOSIS — D500 Iron deficiency anemia secondary to blood loss (chronic): Secondary | ICD-10-CM

## 2014-07-18 LAB — CBC WITH DIFFERENTIAL/PLATELET
Basophils Absolute: 0 10*3/uL (ref 0.0–0.1)
Basophils Relative: 0 % (ref 0–1)
EOS ABS: 0.1 10*3/uL (ref 0.0–0.7)
EOS PCT: 2 % (ref 0–5)
HCT: 34.2 % — ABNORMAL LOW (ref 36.0–46.0)
HEMOGLOBIN: 11.8 g/dL — AB (ref 12.0–15.0)
Lymphocytes Relative: 28 % (ref 12–46)
Lymphs Abs: 1.3 10*3/uL (ref 0.7–4.0)
MCH: 26.7 pg (ref 26.0–34.0)
MCHC: 34.5 g/dL (ref 30.0–36.0)
MCV: 77.4 fL — AB (ref 78.0–100.0)
MONO ABS: 0.6 10*3/uL (ref 0.1–1.0)
MONOS PCT: 12 % (ref 3–12)
Neutro Abs: 2.7 10*3/uL (ref 1.7–7.7)
Neutrophils Relative %: 58 % (ref 43–77)
Platelets: 343 10*3/uL (ref 150–400)
RBC: 4.42 MIL/uL (ref 3.87–5.11)
RDW: 15.5 % (ref 11.5–15.5)
WBC: 4.7 10*3/uL (ref 4.0–10.5)

## 2014-07-18 NOTE — Progress Notes (Signed)
Subjective:    Patient ID: Kendra Saunders, female    DOB: September 11, 1966, 48 y.o.   MRN: [id number]  Anxiety Symptoms include nervous/anxious behavior. Patient reports no chest pain, palpitations, shortness of breath or suicidal ideas.     Chief Complaint  Patient presents with  . Follow-up    new medication review seems to not be as tired  . Anxiety   This chart was scribed for Kendra ChickKristi M Smith, MD by Andrew Auaven Small, ED Scribe. This patient was seen in room 21 and the patient's care was started at 3:30 PM.  HPI Comments: Kendra Kendra Saunders is a 48 y.o. female who presents to the [facility] for six week anxiety f/u. Pt was last seen 1 month ago for menorrhagia. Pt was anemic during her last labs. Pt was told to follow up with GYN. Today, pt state she was seen by Dr. Lowell GuitarPowell, GYN  in which she had a sonohysterogram and took sample of the lining of her uterus. Pt is planning to have an ablation once her results come back. Pt reports they found 2 fibroids.    During last visit pt was taking paxil for anxiety. She was gain weight while taking this medication so we switched her to prozac. Pt's weight is down 3 pounds from last visit. Pt reports prozac does not make her as tired as paxil. She believes she is doing well with this medication. Pt states he anxiety has been controlled. Pt reports a decreased appetite but has not been working out as much. Pt goes back to school within the next 2 weeks.  Pt c/o constant itching to entire back. Pt states she uses aloe and ivory body wash. Pt has tried applying aloe lotion to her back without relief to itching. Pt denies having sensitive skin. Pt states she may of had a rash. Pt washes her clothes with tide, gain, bounty and bounce detergent and fabric softner.    Past Medical History  Diagnosis Date  . Anxiety   . Asthma     childhood   Past Surgical History  Procedure Laterality Date  . Eye surgery      R orbital fracture  . Tubal  ligation    . Ovarian cyst removal    . Tonsillectomy    . Hammer toe surgery     Prior to Admission medications   Medication Sig Start Date End Date Taking? Authorizing Provider  FLUoxetine (PROZAC) 20 MG tablet Take 1 tablet (20 mg total) by mouth daily. 06/13/14  Yes Kendra ChickKristi M Smith, MD  PARoxetine (PAXIL) 20 MG tablet Take 1 tablet (20 mg total) by mouth every morning. 09/18/13   Sherren MochaEva N Shaw, MD   History   Social History  . Marital Status: Single    Spouse Name: n/a    Number of Children: 2  . Years of Education: Master's    Occupational History  . TEACHER Kendra Saunders   Social History Main Topics  . Smoking status: Never Smoker   . Smokeless tobacco: Never Used  . Alcohol Use: Yes     Comment: rarely  . Drug Use: No  . Sexual Activity: Yes    Partners: Male   Other Topics Concern  . Not on file   Social History Narrative   Marital status: divorced since 2002; +dating seriously x 7 years.      Children:  2 children (28, 20); no grandchildren.      Lives:  with her two  children.      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      Tobacco: none       Alcohol: wine once per week.       Exercise:  Walking 3 days per week    Review of Systems  Constitutional: Positive for appetite change.  Respiratory: Negative for shortness of breath.   Cardiovascular: Negative for chest pain and palpitations.  Genitourinary: Positive for menstrual problem.  Skin: Negative for color change and rash.  Psychiatric/Behavioral: Negative for suicidal ideas, sleep disturbance, self-injury and dysphoric mood. The patient is nervous/anxious.       Objective:   Physical Exam  Nursing note and vitals reviewed. Constitutional: She is oriented to person, place, and time. She appears well-developed and well-nourished. No distress.  HENT:  Head: Normocephalic and atraumatic.  Eyes: Conjunctivae are normal. Pupils are equal, round, and  reactive to light.  Neck: Normal range of motion. Neck supple.  Cardiovascular: Normal rate, regular rhythm and normal heart sounds.  Exam reveals no gallop and no friction rub.   No murmur heard. Pulmonary/Chest: Effort normal and breath sounds normal. She has no wheezes. She has no rales.  Neurological: She is alert and oriented to person, place, and time. No cranial nerve deficit. She exhibits normal muscle tone. Coordination normal.  Skin: She is not diaphoretic.  Psychiatric: She has a normal mood and affect. Her behavior is normal. Judgment and thought content normal.   Filed Vitals:   07/18/14 1133  BP: 125/78  Pulse: 82  Temp: 98.8 F (37.1 C)  Resp: 16   Assessment & Plan:   Anxiety state, unspecified  Anemia due to chronic blood loss - Plan: CBC with Differential, CANCELED: POCT CBC  SICKLE CELL TRAIT - Plan: CBC with Differential, CANCELED: POCT CBC  Menorrhagia with regular cycle - Plan: CBC with Differential, CANCELED: POCT CBC  Overweight  1. Anxiety: controlled with switch to Prozac; less sedating than Paxil; no changes to management at this time. 2.  Anemia:  New. Secondary to menorrhagia.  Repeat labs today. 3.  Sickle Cell Trait:  Stable; likely contributing to anemia as well. 4.  Menorrhagia: stable; s/p gynecological examination/consultation; s/p pelvic US that revealed three fibroids. Recommend uterine ablation if recent endometrial sampling is benign.   5. Overweight: improving with stopping Paxil.  Continue to monitor food intake; encourage regular exercise.  No orders of the defined types were placed in this encounter.    I personally performed the services described in this documentation, which was scribed in my presence.  The recorded information has been reviewed and is accurate.   Kendra Saunders, M.D.  [facility] [address] [place], [place]  [address] [phone] phone [phone] fax

## 2014-08-05 ENCOUNTER — Encounter: Payer: Self-pay | Admitting: Radiology

## 2014-08-24 ENCOUNTER — Ambulatory Visit: Payer: BC Managed Care – PPO | Admitting: Family Medicine

## 2014-09-07 ENCOUNTER — Ambulatory Visit: Payer: BC Managed Care – PPO | Admitting: Family Medicine

## 2014-10-19 ENCOUNTER — Encounter: Payer: Self-pay | Admitting: Family Medicine

## 2014-10-19 ENCOUNTER — Ambulatory Visit (INDEPENDENT_AMBULATORY_CARE_PROVIDER_SITE_OTHER): Payer: BC Managed Care – PPO | Admitting: Family Medicine

## 2014-10-19 VITALS — BP 128/80 | HR 79 | Temp 98.6°F | Resp 16 | Ht 70.0 in | Wt 198.4 lb

## 2014-10-19 DIAGNOSIS — D509 Iron deficiency anemia, unspecified: Secondary | ICD-10-CM

## 2014-10-19 DIAGNOSIS — Z131 Encounter for screening for diabetes mellitus: Secondary | ICD-10-CM

## 2014-10-19 DIAGNOSIS — Z803 Family history of malignant neoplasm of breast: Secondary | ICD-10-CM

## 2014-10-19 DIAGNOSIS — Z0000 Encounter for general adult medical examination without abnormal findings: Secondary | ICD-10-CM

## 2014-10-19 DIAGNOSIS — G43009 Migraine without aura, not intractable, without status migrainosus: Secondary | ICD-10-CM

## 2014-10-19 DIAGNOSIS — D573 Sickle-cell trait: Secondary | ICD-10-CM

## 2014-10-19 DIAGNOSIS — E663 Overweight: Secondary | ICD-10-CM

## 2014-10-19 DIAGNOSIS — Z13220 Encounter for screening for lipoid disorders: Secondary | ICD-10-CM

## 2014-10-19 DIAGNOSIS — F411 Generalized anxiety disorder: Secondary | ICD-10-CM

## 2014-10-19 DIAGNOSIS — Z7251 High risk heterosexual behavior: Secondary | ICD-10-CM

## 2014-10-19 DIAGNOSIS — Z23 Encounter for immunization: Secondary | ICD-10-CM

## 2014-10-19 LAB — CBC WITH DIFFERENTIAL/PLATELET
BASOS PCT: 0 % (ref 0–1)
Basophils Absolute: 0 10*3/uL (ref 0.0–0.1)
Eosinophils Absolute: 0.1 10*3/uL (ref 0.0–0.7)
Eosinophils Relative: 2 % (ref 0–5)
HEMATOCRIT: 32 % — AB (ref 36.0–46.0)
HEMOGLOBIN: 11.1 g/dL — AB (ref 12.0–15.0)
Lymphocytes Relative: 26 % (ref 12–46)
Lymphs Abs: 1.4 10*3/uL (ref 0.7–4.0)
MCH: 27.3 pg (ref 26.0–34.0)
MCHC: 34.7 g/dL (ref 30.0–36.0)
MCV: 78.8 fL (ref 78.0–100.0)
MONO ABS: 0.5 10*3/uL (ref 0.1–1.0)
MONOS PCT: 9 % (ref 3–12)
NEUTROS ABS: 3.3 10*3/uL (ref 1.7–7.7)
Neutrophils Relative %: 63 % (ref 43–77)
Platelets: 357 10*3/uL (ref 150–400)
RBC: 4.06 MIL/uL (ref 3.87–5.11)
RDW: 15.6 % — ABNORMAL HIGH (ref 11.5–15.5)
WBC: 5.3 10*3/uL (ref 4.0–10.5)

## 2014-10-19 LAB — POCT URINALYSIS DIPSTICK
BILIRUBIN UA: NEGATIVE
GLUCOSE UA: NEGATIVE
Ketones, UA: NEGATIVE
Leukocytes, UA: NEGATIVE
Nitrite, UA: NEGATIVE
Protein, UA: NEGATIVE
Spec Grav, UA: 1.01
Urobilinogen, UA: 0.2
pH, UA: 6

## 2014-10-19 LAB — LIPID PANEL
Cholesterol: 166 mg/dL (ref 0–200)
HDL: 33 mg/dL — ABNORMAL LOW (ref >39)
LDL CALC: 96 mg/dL (ref 0–99)
TRIGLYCERIDES: 187 mg/dL — AB (ref <150)
Total CHOL/HDL Ratio: 5 Ratio
VLDL: 37 mg/dL (ref 0–40)

## 2014-10-19 LAB — COMPLETE METABOLIC PANEL WITH GFR
ALT: 15 U/L (ref 0–35)
AST: 17 U/L (ref 0–37)
Albumin: 4.5 g/dL (ref 3.5–5.2)
Alkaline Phosphatase: 44 U/L (ref 39–117)
BUN: 7 mg/dL (ref 6–23)
CO2: 25 mEq/L (ref 19–32)
Calcium: 9.7 mg/dL (ref 8.4–10.5)
Chloride: 103 mEq/L (ref 96–112)
Creat: 0.88 mg/dL (ref 0.50–1.10)
GFR, EST NON AFRICAN AMERICAN: 77 mL/min
GFR, Est African American: 89 mL/min
GLUCOSE: 83 mg/dL (ref 70–99)
POTASSIUM: 4.2 meq/L (ref 3.5–5.3)
Sodium: 138 mEq/L (ref 135–145)
Total Bilirubin: 0.5 mg/dL (ref 0.2–1.2)
Total Protein: 7.7 g/dL (ref 6.0–8.3)

## 2014-10-19 LAB — POCT UA - MICROSCOPIC ONLY
CRYSTALS, UR, HPF, POC: NEGATIVE
Casts, Ur, LPF, POC: NEGATIVE
MUCUS UA: POSITIVE
Yeast, UA: NEGATIVE

## 2014-10-19 LAB — HEMOGLOBIN A1C
HEMOGLOBIN A1C: 5.7 % — AB (ref <5.7)
Mean Plasma Glucose: 117 mg/dL — ABNORMAL HIGH (ref <117)

## 2014-10-19 LAB — HIV ANTIBODY (ROUTINE TESTING W REFLEX): HIV 1&2 Ab, 4th Generation: NONREACTIVE

## 2014-10-19 LAB — RPR

## 2014-10-19 MED ORDER — SUMATRIPTAN SUCCINATE 100 MG PO TABS: 100.0000 mg | ORAL_TABLET | ORAL | Status: DC | PRN

## 2014-10-19 MED ORDER — FLUOXETINE HCL 20 MG PO TABS: 20.0000 mg | ORAL_TABLET | Freq: Every day | ORAL | Status: DC

## 2014-10-19 NOTE — Patient Instructions (Signed)

## 2014-10-19 NOTE — Progress Notes (Signed)
Subjective:    Patient ID: Kendra GurneyDecolia Saunders, female    DOB: 03/21/1966, 48 y.o.   MRN: [id number]  10/19/2014  Annual Exam   HPI This 48 y.o. female presents for Complete Physical Examination.  Last physical: 09/09/2013 Kendra Saunders Pap smear:  2013.  Regular menses (monthly; 3-4 days; heavy for two days; cramping initially).  Currently on menses. Mammogram: 06/2014 Colonoscopy: never Bone density: never TDAP:  2010 Pneumovax:  never Zostavax:  never Influenza:  today Eye exam:  03/2014; [facility]; no g/c. Dental exam:  Every six months; 06/2014.  Anxiety: working well.  Worse during PMS days; duration two days.  Patient reports good compliance with medication, good tolerance to medication, and good symptom control.    Family history of Breast cancer: mother died of breast cancer; two half sisters with breast cancer.  Went for genetic testing twelve years and insurance did not cover.  No breast MRI.   Mother's aunt had breast cancer.  Aunt's daughter had breast cancer.  No ovarian cancer.   Headaches: reports having intermittent migraine like headaches with photophobia and nausea. No vomiting.  Has been taking daughter's Imitrex with good treatment of headache; requesting rx for Imitrex.  No vision changes, n/t/w with headaches. No nighttime awakening.    Review of Systems  Constitutional: Negative for fever, chills, diaphoresis, activity change, appetite change, fatigue and unexpected weight change.  HENT: Negative for congestion, dental problem, drooling, ear discharge, ear pain, facial swelling, hearing loss, mouth sores, nosebleeds, postnasal drip, rhinorrhea, sinus pressure, sneezing, sore throat, tinnitus, trouble swallowing and voice change.   Eyes: Positive for photophobia. Negative for pain, discharge, redness, itching and visual disturbance.  Respiratory: Negative for apnea, cough, choking, chest tightness, shortness of breath, wheezing and stridor.   Cardiovascular: Negative for  chest pain, palpitations and leg swelling.  Gastrointestinal: Negative for nausea, vomiting, abdominal pain, diarrhea, constipation, blood in stool, abdominal distention, anal bleeding and rectal pain.  Endocrine: Negative for cold intolerance, heat intolerance, polydipsia, polyphagia and polyuria.  Genitourinary: Negative for dysuria, urgency, frequency, hematuria, flank pain, decreased urine volume, vaginal bleeding, vaginal discharge, enuresis, difficulty urinating, genital sores, vaginal pain, menstrual problem, pelvic pain and dyspareunia.  Musculoskeletal: Negative for myalgias, back pain, joint swelling, arthralgias, gait problem, neck pain and neck stiffness.  Skin: Negative for color change, pallor, rash and wound.  Allergic/Immunologic: Negative for environmental allergies, food allergies and immunocompromised state.  Neurological: Positive for headaches. Negative for dizziness, tremors, seizures, syncope, facial asymmetry, speech difficulty, weakness, light-headedness and numbness.  Hematological: Negative for adenopathy. Does not bruise/bleed easily.  Psychiatric/Behavioral: Negative for suicidal ideas, hallucinations, behavioral problems, confusion, sleep disturbance, self-injury, dysphoric mood, decreased concentration and agitation. The patient is nervous/anxious. The patient is not hyperactive.     Past Medical History  Diagnosis Date  . Anxiety   . Asthma     childhood; last Albuterol use elementary school.  . Migraine    Past Surgical History  Procedure Laterality Date  . Eye surgery      R orbital fracture  . Tubal ligation    . Ovarian cyst removal    . Tonsillectomy    . Hammer toe surgery     Allergies  Allergen Reactions  . Peanuts [Peanut Oil]     Throat itches and she has stomach pains   Current Outpatient Prescriptions  Medication Sig Dispense Refill  . FLUoxetine (PROZAC) 20 MG tablet Take 1-2 tablets (20-40 mg total) by mouth daily. 45 tablet 5  .  SUMAtriptan (  IMITREX) 100 MG tablet Take 1 tablet (100 mg total) by mouth every 2 (two) hours as needed for migraine or headache. May repeat in 2 hours if headache persists or recurs. 8 tablet 5   No current facility-administered medications for this visit.       Objective:    BP 128/80 mmHg  Pulse 79  Temp(Src) 98.6 F (37 C) (Oral)  Resp 16  Ht 5\' 10"  (1.778 m)  Wt 198 lb 6.4 oz (89.994 kg)  BMI 28.47 kg/m2  SpO2 100%  LMP 10/16/2014 Physical Exam  Constitutional: She is oriented to person, place, and time. She appears well-developed and well-nourished. No distress.  HENT:  Head: Normocephalic and atraumatic.  Right Ear: External ear normal.  Left Ear: External ear normal.  Nose: Nose normal.  Mouth/Throat: Oropharynx is clear and moist.  Eyes: Conjunctivae and EOM are normal. Pupils are equal, round, and reactive to light.  Neck: Normal range of motion and full passive range of motion without pain. Neck supple. No JVD present. Carotid bruit is not present. No thyromegaly present.  Cardiovascular: Normal rate, regular rhythm and normal heart sounds.  Exam reveals no gallop and no friction rub.   No murmur heard. Pulmonary/Chest: Effort normal and breath sounds normal. She has no wheezes. She has no rales. Right breast exhibits no inverted nipple, no mass, no nipple discharge, no skin change and no tenderness. Left breast exhibits no inverted nipple, no mass, no nipple discharge, no skin change and no tenderness. Breasts are symmetrical.  Abdominal: Soft. Bowel sounds are normal. She exhibits no distension and no mass. There is no tenderness. There is no rebound and no guarding.  Musculoskeletal:       Right shoulder: Normal.       Left shoulder: Normal.       Cervical back: Normal.  Lymphadenopathy:    She has no cervical adenopathy.  Neurological: She is alert and oriented to person, place, and time. She has normal reflexes. No cranial nerve deficit. She exhibits normal  muscle tone. Coordination normal.  Skin: Skin is warm and dry. No rash noted. She is not diaphoretic. No erythema. No pallor.  Psychiatric: She has a normal mood and affect. Her behavior is normal. Judgment and thought content normal.  Nursing note and vitals reviewed.  INFLUENZA VACCINE ADMINISTERED.     Assessment & Plan:   1. Routine general medical examination at a health care facility   2. Iron deficiency anemia   3. Screening for cholesterol level   4. Need for prophylactic vaccination and inoculation against influenza   5. High risk sexual behavior   6. Family history of breast cancer in first degree relative   7. Migraine without aura and without status migrainosus, not intractable   8. Anxiety state   9. SICKLE CELL TRAIT      1.  Complete Physical Examination: anticipatory guidance --- continued exercise, restart exercising.  Return in upcoming two months for pap smear.  Mammogram UTD.  Immunizations UTD; s/p influenza vaccine.  2.  Iron deficiency anemia: stable; with sick cell trait; obtain labs.  Considering uterine ablation with [facility]. 3.  Screening for cholesterol: obtain labs. 4.  High risk sexual behavior: per current CDC guidelines, obtain HIV, RPR. 5.  Family history of breast cancer in mother: and multiple other family members.  Refer for genetic testing; may warrant oncology consultation as well.  May also warrant MRI breast.  Mammogram UTD. 6.  Migraine headaches: New.  Rx for  Imitrex provided.  Normal neurological exam. 7. Anxiety: controlled except associated with menses; increase Fluoxetine to 40mg  daily during menses.    Meds ordered this encounter  Medications  . FLUoxetine (PROZAC) 20 MG tablet    Sig: Take 1-2 tablets (20-40 mg total) by mouth daily.    Dispense:  45 tablet    Refill:  5  . SUMAtriptan (IMITREX) 100 MG tablet    Sig: Take 1 tablet (100 mg total) by mouth every 2 (two) hours as needed for migraine or headache. May repeat in 2 hours  if headache persists or recurs.    Dispense:  8 tablet    Refill:  5    Return in about 4 weeks (around 11/16/2014) for pap smear after Christmas.    Kendra Saunders, M.D.  [facility]  [place] [address] [place], [place]  [id number] [phone] phone [phone] fax

## 2014-10-23 ENCOUNTER — Encounter: Payer: Self-pay | Admitting: Family Medicine

## 2014-10-23 DIAGNOSIS — E663 Overweight: Secondary | ICD-10-CM | POA: Insufficient documentation

## 2014-10-30 NOTE — Addendum Note (Signed)
Addended by: Ethelda ChickSMITH, Emarion Toral M on: 10/30/2014 01:57 PM   Modules accepted: Orders

## 2014-11-02 ENCOUNTER — Telehealth: Payer: Self-pay | Admitting: Genetic Counselor

## 2014-11-02 NOTE — Telephone Encounter (Signed)
left message for patient to return call to schedule genetic appt,.

## 2014-11-17 ENCOUNTER — Telehealth: Payer: Self-pay

## 2014-11-17 NOTE — Telephone Encounter (Signed)
Dr.Smith, Pt would like to know if she should see you or just be referred to have a mammogram done, she states that she has a history of breast cancer and has detected a "spot" on her breast.  Best# [phone]

## 2014-11-18 NOTE — Telephone Encounter (Signed)
Pt wants to schedule an appt- Transferred to appt scheduling.

## 2014-11-18 NOTE — Telephone Encounter (Signed)
Needs office visit for evaluation.

## 2014-11-19 ENCOUNTER — Ambulatory Visit (INDEPENDENT_AMBULATORY_CARE_PROVIDER_SITE_OTHER): Payer: BC Managed Care – PPO | Admitting: Physician Assistant

## 2014-11-19 VITALS — BP 114/78 | HR 85 | Temp 98.6°F | Resp 16 | Ht 69.75 in | Wt 202.4 lb

## 2014-11-19 DIAGNOSIS — N611 Abscess of the breast and nipple: Secondary | ICD-10-CM

## 2014-11-19 DIAGNOSIS — T3695XA Adverse effect of unspecified systemic antibiotic, initial encounter: Secondary | ICD-10-CM

## 2014-11-19 DIAGNOSIS — B379 Candidiasis, unspecified: Secondary | ICD-10-CM

## 2014-11-19 DIAGNOSIS — N61 Inflammatory disorders of breast: Secondary | ICD-10-CM

## 2014-11-19 MED ORDER — DOXYCYCLINE HYCLATE 100 MG PO CAPS: 100.0000 mg | ORAL_CAPSULE | Freq: Two times a day (BID) | ORAL | Status: AC

## 2014-11-19 NOTE — Progress Notes (Signed)
[facility] [address], [place] [address] [phone]  Date:  11/19/2014   Name:  Kendra GurneyDecolia Saunders   DOB:  12-06-1966   MRN:  [id number]  PCP:  Nilda SimmerSMITH,KRISTI, MD    Chief Complaint: Recurrent Skin Infections   History of Present Illness:  Kendra GurneyDecolia Saunders is a 48 y.o. very pleasant female patient who presents with the following: 3 Days of breast pain and swelling.  Symptoms began with mild discomfort and a small bump.  The next day, she had increased pain and swelling.  The bump also became larger.  By yesterday, it was exquisitely tender with erythema, and movement, such as walking, was incredibly painful.  She denies any rash or breast changes in skin before 3 days ago.  She does not shave or cut hair around the breast, and denies any injury to the breast.   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  She denies drainage at the site or nipple.  She denies fever, n/v, sob, palpitations, or dizziness.  She has a strong familial history of breast cancer ((Mother-deceased at 7639), half-sisters, and daughter of half-sister)).  She has annual mammograms without any findings at last visit 07/06/2014.   Patient Active Problem List   Diagnosis Date Noted  . Overweight (BMI 25.0-29.9) 10/23/2014  . Anxiety state 01/27/2013  . 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 SICKLE CELL TRAIT 06/24/2007  . COMMON MIGRAINE 06/24/2007  . FX, FACIAL BONE NEC, CLOSED 06/24/2007    Past Medical History  Diagnosis Date  . Anxiety   . Asthma     childhood; last Albuterol use elementary school.  . Migraine     Past Surgical History  Procedure Laterality Date  . Eye surgery      R orbital fracture  . Tubal ligation    . Ovarian cyst removal    . Tonsillectomy    . Hammer toe surgery      History  Substance Use Topics  . Smoking status: Never Smoker   . Smokeless tobacco: Never Used  . Alcohol Use: Yes     Comment: rarely    Family History  Problem Relation Age of Onset  . Cancer  Mother 6539    breast cancer  . Cancer Father 4674    brain cancer; prostate cancer  . Heart disease Brother 39    heart attack  . Cancer Paternal Grandfather     breast cancer  . Lupus Daughter   . Cancer Sister     breast cancer  . Cancer Sister 6955    breast cancer    Allergies  Allergen Reactions  . Peanuts [Peanut Oil]     Throat itches and she has stomach pains    Medication list has been reviewed and updated.  Current Outpatient Prescriptions on File Prior to Visit  Medication Sig Dispense Refill  . FLUoxetine (PROZAC) 20 MG tablet Take 1-2 tablets (20-40 mg total) by mouth daily. 45 tablet 5  . SUMAtriptan (IMITREX) 100 MG tablet Take 1 tablet (100 mg total) by mouth every 2 (two) hours as needed for migraine or headache. May repeat in 2 hours if headache persists or recurs. 8 tablet 5   No current facility-administered medications on file prior to visit.    Review of Systems: ROS otherwise unremarkable unless listed above.    Physical Examination: Filed Vitals:   11/19/14 1515  BP: 114/78  Pulse: 85  Temp: 98.6 F (37 C)  Resp: 16   Filed  Vitals:   11/19/14 1515  Height: 5' 9.75" (1.772 m)  Weight: 202 lb 6 oz (91.797 kg)   Body mass index is 29.23 kg/(m^2). Ideal Body Weight: Weight in (lb) to have BMI = 25: 172.6   Physical Exam  Constitutional: She is oriented to person, place, and time. She appears well-developed and well-nourished.  Neck: Neck supple.  Cardiovascular: Normal rate, regular rhythm and normal heart sounds.   Pulmonary/Chest: Effort normal. No respiratory distress. She has no wheezes.  Neurological: She is alert and oriented to person, place, and time.  Skin: Skin is warm and dry.  Circumferential erythema, swelling and warmth at fluctuant bulbus mass at right lower quadrant of left breast.  Very tender to palpation.  No discharge or leakage.  Normal r.ight breast  Psychiatric: She has a normal mood and affect. Her behavior is normal.  Judgment and thought content normal.    Procedure: Consent obtained.  Alcohol swab injection site.  1% Lidocaine injected for anesthesia.  Abscess cleaned with povidine.  1.5cm incision made with 11 blade.  Ample drainage present.  Culture was obtained.  Sebaceous material also excised without.  Incision cite searched for pocketed abscess, which produced additional drainage.  Irrigated with 1cc of lidocaine.   Packing strips applied.  Skin cleaned with normal saline.  Dressings applied.      Assessment and Plan: Abscess of skin of breast  doxycycline (VIBRAMYCIN) 100 MG capsule, BID for 10 days Wound culture Wound care discussed with patient Return in 48 hrs for repacking and wound check   Trena PlattStephanie English, PA-C [facility] 12/12/20155:05 PM

## 2014-11-19 NOTE — Patient Instructions (Addendum)
Incision and Drainage Incision and drainage is a procedure in which a sac-like structure (cystic structure) is opened and drained. The area to be drained usually contains material such as pus, fluid, or blood.  LET YOUR CAREGIVER KNOW ABOUT:   Allergies to medicine.  Medicines taken, including vitamins, herbs, eyedrops, over-the-counter medicines, and creams.  Use of steroids (by mouth or creams).  Previous problems with anesthetics or numbing medicines.  History of bleeding problems or blood clots.  Previous surgery.  Other health problems, including diabetes and kidney problems.  Possibility of pregnancy, if this applies. RISKS AND COMPLICATIONS  Pain.  Bleeding.  Scarring.  Infection. BEFORE THE PROCEDURE  You may need to have an ultrasound or other imaging tests to see how large or deep your cystic structure is. Blood tests may also be used to determine if you have an infection or how severe the infection is. You may need to have a tetanus shot. PROCEDURE  The affected area is cleaned with a cleaning fluid. The cyst area will then be numbed with a medicine (local anesthetic). A small incision will be made in the cystic structure. A syringe or catheter may be used to drain the contents of the cystic structure, or the contents may be squeezed out. The area will then be flushed with a cleansing solution. After cleansing the area, it is often gently packed with a gauze or another wound dressing. Once it is packed, it will be covered with gauze and tape or some other type of wound dressing. AFTER THE PROCEDURE   Often, you will be allowed to go home right after the procedure.  You may be given antibiotic medicine to prevent or heal an infection.  If the area was packed with gauze or some other wound dressing, you will likely need to come back in 1 to 2 days to get it removed.  The area should heal in about 14 days. Document Released: 05/21/2001 Document Revised: 05/26/2012  Document Reviewed: 01/20/2012 ExitCare Patient Information 2015 [employer]. This information is not intended to replace advice given to you by your health care provider. Make sure you discuss any questions you have with your health care provider.  

## 2014-11-20 MED ORDER — FLUCONAZOLE 150 MG PO TABS: 150.0000 mg | ORAL_TABLET | Freq: Once | ORAL | Status: DC

## 2014-11-21 ENCOUNTER — Ambulatory Visit (INDEPENDENT_AMBULATORY_CARE_PROVIDER_SITE_OTHER): Payer: BC Managed Care – PPO | Admitting: Physician Assistant

## 2014-11-21 VITALS — BP 124/80 | HR 96 | Temp 98.0°F | Resp 16 | Ht 69.75 in | Wt 202.0 lb

## 2014-11-21 DIAGNOSIS — N611 Abscess of the breast and nipple: Secondary | ICD-10-CM

## 2014-11-21 DIAGNOSIS — N61 Inflammatory disorders of breast: Secondary | ICD-10-CM

## 2014-11-21 NOTE — Progress Notes (Signed)
   Subjective:    Patient ID: Kendra GurneyDecolia Saunders, female    DOB: [birth year], 48 y.o.   MRN: [id number]  Chief Complaint  Patient presents with  . Wound Check    HPI  Presents for wound care, s/p I&D of an abscess of the LEFT breast 2 days ago. She's still uncomfortable, but pain is significantly improved. Tolerating the doxycyline without adverse effects as long as she takes it with food. No fever, chills.  Wound culture preliminary report reveals no growth.  Review of Systems     Objective:   Physical Exam  Constitutional: She is oriented to person, place, and time. She appears well-developed and well-nourished. She is active and cooperative. No distress.  BP 124/80 mmHg  Pulse 96  Temp(Src) 98 F (36.7 C) (Oral)  Resp 16  Ht 5' 9.75" (1.772 m)  Wt 202 lb (91.627 kg)  BMI 29.18 kg/m2  SpO2 100%  LMP 11/09/2014   Eyes: Conjunctivae are normal.  Pulmonary/Chest: Effort normal.    Dressing and packing removed. No erythema. Wound cavity is open, walls are beefy red without exudate or eschar. Mild induration is minimally tender. No packing placed. Bandage applied.  Neurological: She is alert and oriented to person, place, and time.  Skin: Skin is warm and dry.  Psychiatric: She has a normal mood and affect. Her speech is normal and behavior is normal.          Assessment & Plan:  1. Abscess of skin of breast Complete antibiotic. Continue warm compresses. Wash daily with soap and water. Cover wound until healed completely. RTC if pain increases. Follow-up with Dr. Katrinka BlazingSmith on 12/07/14 as planned.  Fernande Brashelle S. Akasia Ahmad, PA-C Physician Assistant-Certified [facility]

## 2014-11-21 NOTE — Patient Instructions (Signed)
Continue the antibiotic as prescribed. Apply a warm compress to the area for 15-20 minutes 2-4 times each day. Wash the area daily with soap and water. Cover the wound until it is completely healed.

## 2014-11-22 LAB — WOUND CULTURE
Gram Stain: NONE SEEN
Gram Stain: NONE SEEN
Gram Stain: NONE SEEN
Organism ID, Bacteria: NO GROWTH

## 2014-11-30 ENCOUNTER — Telehealth: Payer: Self-pay | Admitting: Physician Assistant

## 2014-11-30 NOTE — Telephone Encounter (Signed)
Contacted patient to inform of culture.  Patient is doing fine.  Uses a small bandage.  She has very minimal drainage, and every time is an improvement.  Advised to keep wound cleaned and dry.  She is aggreeable to this plan.  She is to follow up with Dr. Katrinka BlazingSmith in 7 days.

## 2014-12-07 ENCOUNTER — Ambulatory Visit: Payer: BC Managed Care – PPO | Admitting: Family Medicine

## 2014-12-26 ENCOUNTER — Ambulatory Visit (INDEPENDENT_AMBULATORY_CARE_PROVIDER_SITE_OTHER): Payer: BC Managed Care – PPO | Admitting: Family Medicine

## 2014-12-26 ENCOUNTER — Encounter: Payer: Self-pay | Admitting: Family Medicine

## 2014-12-26 VITALS — BP 120/80 | HR 80 | Temp 98.8°F | Resp 16 | Ht 69.75 in | Wt 199.0 lb

## 2014-12-26 DIAGNOSIS — J0100 Acute maxillary sinusitis, unspecified: Secondary | ICD-10-CM

## 2014-12-26 DIAGNOSIS — Z124 Encounter for screening for malignant neoplasm of cervix: Secondary | ICD-10-CM

## 2014-12-26 DIAGNOSIS — J029 Acute pharyngitis, unspecified: Secondary | ICD-10-CM

## 2014-12-26 MED ORDER — IPRATROPIUM BROMIDE 0.03 % NA SOLN: 2.0000 | Freq: Two times a day (BID) | NASAL | Status: DC

## 2014-12-26 MED ORDER — AMOXICILLIN 500 MG PO TABS: 1000.0000 mg | ORAL_TABLET | Freq: Two times a day (BID) | ORAL | Status: DC

## 2014-12-26 NOTE — Progress Notes (Signed)
Subjective:    Patient ID: Kendra Saunders, female    DOB: February 15, 1966, 49 y.o.   MRN: [id number]  12/26/2014  Pap smear; Nasal Congestion; Sore Throat; and Ear Pain   HPI This 49 y.o. female presents for the following:  1.  Cold: onset one week ago.  No fever/chills/sweats.  No headache.  +ST persistent.  Diffuse; +pain with swallowing.  No drooling or spitting.  +rhinorrhea.  +nasal congestion.  +cough; no SOB.  +sputum production yellow.  No v/d.  Taking Mucinex, Alkeseltzer cold, theraflu, tea.  No better.  R ear pain onset yesterday.   2. Pap smear: due for pap smear. Was on menses during CPE.   Regular menses; LMP 12/03/14; s/p BTL; same partner x 8 years.  No history of abnormal pap smears.  3.  Family history of breast cancer: pt playing phone tag with genetics office.     Review of Systems  Constitutional: Negative for fever, chills, diaphoresis and fatigue.  HENT: Positive for congestion, ear pain, postnasal drip, rhinorrhea, sinus pressure, sore throat, trouble swallowing and voice change.   Respiratory: Positive for cough. Negative for shortness of breath.   Gastrointestinal: Negative for nausea, vomiting, abdominal pain and diarrhea.  Genitourinary: Negative for dysuria, frequency, hematuria, flank pain, vaginal bleeding, vaginal discharge, vaginal pain and menstrual problem.  Neurological: Negative for headaches.    Past Medical History  Diagnosis Date  . Anxiety   . Asthma     childhood; last Albuterol use elementary school.  . Migraine    Past Surgical History  Procedure Laterality Date  . Eye surgery      R orbital fracture  . Tubal ligation    . Ovarian cyst removal    . Tonsillectomy    . Hammer toe surgery     Allergies  Allergen Reactions  . Peanuts [Peanut Oil]     Throat itches and she has stomach pains   Current Outpatient Prescriptions  Medication Sig Dispense Refill  . fluconazole (DIFLUCAN) 150 MG tablet Take 1 tablet (150 mg total) by mouth  once. Repeat if needed 2 tablet 0  . FLUoxetine (PROZAC) 20 MG tablet Take 1-2 tablets (20-40 mg total) by mouth daily. 45 tablet 5  . SUMAtriptan (IMITREX) 100 MG tablet Take 1 tablet (100 mg total) by mouth every 2 (two) hours as needed for migraine or headache. May repeat in 2 hours if headache persists or recurs. 8 tablet 5  . amoxicillin (AMOXIL) 500 MG tablet Take 2 tablets (1,000 mg total) by mouth 2 (two) times daily. 40 tablet 0  . ipratropium (ATROVENT) 0.03 % nasal spray Place 2 sprays into the nose 2 (two) times daily. 30 mL 0   No current facility-administered medications for this visit.       Objective:    BP 120/80 mmHg  Pulse 80  Temp(Src) 98.8 F (37.1 C) (Oral)  Resp 16  Ht 5' 9.75" (1.772 m)  Wt 199 lb (90.266 kg)  BMI 28.75 kg/m2  SpO2 100%  LMP  (Within Weeks) Physical Exam  Constitutional: She is oriented to person, place, and time. She appears well-developed and well-nourished. No distress.  HENT:  Head: Normocephalic and atraumatic.  Right Ear: External ear normal.  Left Ear: External ear normal.  Nose: Mucosal edema and rhinorrhea present. Right sinus exhibits no frontal sinus tenderness. Left sinus exhibits maxillary sinus tenderness. Left sinus exhibits no frontal sinus tenderness.  Mouth/Throat: Mucous membranes are normal. Posterior oropharyngeal edema and posterior oropharyngeal erythema  present. No oropharyngeal exudate or tonsillar abscesses.  Eyes: Conjunctivae are normal. Pupils are equal, round, and reactive to light.  Neck: Normal range of motion. Neck supple.  Cardiovascular: Normal rate, regular rhythm and normal heart sounds.  Exam reveals no gallop and no friction rub.   No murmur heard. Pulmonary/Chest: Effort normal and breath sounds normal. She has no wheezes. She has no rales.  Genitourinary: Rectal exam shows external hemorrhoid. There is no rash, tenderness or lesion on the right labia. There is no rash, tenderness or lesion on the left  labia. Uterus is enlarged. Uterus is not tender. Cervix exhibits no motion tenderness and no friability. Right adnexum displays no mass, no tenderness and no fullness. Left adnexum displays no mass, no tenderness and no fullness. Vaginal discharge found.  Neurological: She is alert and oriented to person, place, and time.  Skin: She is not diaphoretic.  Psychiatric: She has a normal mood and affect. Her behavior is normal.  Nursing note and vitals reviewed.       Assessment & Plan:   1. Cervical cancer screening   2. Sore throat   3. Acute maxillary sinusitis, recurrence not specified     1. Cervical cancer screening: Pap smear obtained; regular menses; s/p BTL; obtain GC/Chlam.  Mammogram is UTD in 06/2014. 2.  Sore throat: New. 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  Treat symptomatically with gargles, Ibuprofen. RTC inability to swallow. 3. Acute maxillary sinusitis: New.  Secondary to acute URI.  Rx for Amoxicillin and Atrovent nasal spray provided; continue Mucinex.   Meds ordered this encounter  Medications  . amoxicillin (AMOXIL) 500 MG tablet    Sig: Take 2 tablets (1,000 mg total) by mouth 2 (two) times daily.    Dispense:  40 tablet    Refill:  0  . ipratropium (ATROVENT) 0.03 % nasal spray    Sig: Place 2 sprays into the nose 2 (two) times daily.    Dispense:  30 mL    Refill:  0    Return in about 6 months (around 06/26/2015) for recheck.    Jerrell Mangel Paulita FujitaMartin Andjela Wickes, M.D. [facility]  [place] [address] [place]  [address] [phone] phone [phone]) [phone] fax

## 2014-12-27 LAB — CBC WITH DIFFERENTIAL/PLATELET
BASOS PCT: 1 % (ref 0–1)
Basophils Absolute: 0.1 10*3/uL (ref 0.0–0.1)
EOS PCT: 2 % (ref 0–5)
Eosinophils Absolute: 0.1 10*3/uL (ref 0.0–0.7)
HCT: 37 % (ref 36.0–46.0)
Hemoglobin: 12.4 g/dL (ref 12.0–15.0)
LYMPHS ABS: 1.8 10*3/uL (ref 0.7–4.0)
Lymphocytes Relative: 26 % (ref 12–46)
MCH: 26.9 pg (ref 26.0–34.0)
MCHC: 33.5 g/dL (ref 30.0–36.0)
MCV: 80.3 fL (ref 78.0–100.0)
MPV: 9.5 fL (ref 8.6–12.4)
Monocytes Absolute: 1 10*3/uL (ref 0.1–1.0)
Monocytes Relative: 14 % — ABNORMAL HIGH (ref 3–12)
Neutro Abs: 4 10*3/uL (ref 1.7–7.7)
Neutrophils Relative %: 57 % (ref 43–77)
PLATELETS: 366 10*3/uL (ref 150–400)
RBC: 4.61 MIL/uL (ref 3.87–5.11)
RDW: 15.2 % (ref 11.5–15.5)
WBC: 7 10*3/uL (ref 4.0–10.5)

## 2014-12-27 LAB — EPSTEIN-BARR VIRUS VCA ANTIBODY PANEL
EBV EA IgG: 6.6 U/mL (ref <9.0)
EBV NA IGG: 127 U/mL — AB (ref <18.0)
EBV VCA IGM: 16.7 U/mL (ref <36.0)
EBV VCA IgG: >750 U/mL — ABNORMAL HIGH (ref <18.0)

## 2014-12-28 LAB — PAP IG, CT-NG NAA, HPV HIGH-RISK
Chlamydia Probe Amp: NEGATIVE
GC Probe Amp: NEGATIVE
HPV DNA High Risk: NOT DETECTED

## 2014-12-28 LAB — CULTURE, GROUP A STREP: Organism ID, Bacteria: NORMAL

## 2015-01-19 ENCOUNTER — Other Ambulatory Visit: Payer: Self-pay | Admitting: Family Medicine

## 2015-02-09 ENCOUNTER — Encounter: Payer: Self-pay | Admitting: Family Medicine

## 2015-03-30 ENCOUNTER — Telehealth: Payer: Self-pay

## 2015-03-30 NOTE — Telephone Encounter (Signed)
I don't see where I have ever evaluated her for a yeast infection; recommend office visit if OTC medications do not work.

## 2015-03-30 NOTE — Telephone Encounter (Signed)
Pt can try an OTC vaginal yeast application like monostat and if this does not help, she should RTC for further evaluation.

## 2015-03-30 NOTE — Telephone Encounter (Signed)
Pt states that she has a yeast infection and she would like to have something called in for this. She also adds that she does get them frequently.  Best# [phone]

## 2015-03-30 NOTE — Telephone Encounter (Signed)
Called Kendra Saunders, advised her to RTC. She states Dr. Katrinka BlazingSmith knows about her recurrent yeast infections and would like to get a response from her. Please advise.

## 2015-03-30 NOTE — Telephone Encounter (Signed)
I believe she has to RTC. Please advise.

## 2015-03-31 NOTE — Telephone Encounter (Signed)
Called pt to inform her to RTC for eval. Pt will try OTC meds and will return if it is not better.

## 2015-05-25 ENCOUNTER — Encounter (HOSPITAL_COMMUNITY): Admission: RE | Payer: Self-pay | Source: Ambulatory Visit

## 2015-05-25 ENCOUNTER — Ambulatory Visit (HOSPITAL_COMMUNITY): Admission: RE | Admit: 2015-05-25 | Payer: Self-pay | Source: Ambulatory Visit | Admitting: Obstetrics and Gynecology

## 2015-05-25 SURGERY — DILATATION & CURETTAGE/HYSTEROSCOPY WITH HYDROTHERMAL ABLATION
Anesthesia: Choice

## 2015-06-26 ENCOUNTER — Ambulatory Visit: Payer: BC Managed Care – PPO | Admitting: Family Medicine

## 2015-07-17 ENCOUNTER — Ambulatory Visit: Payer: BC Managed Care – PPO | Admitting: Family Medicine

## 2015-09-01 ENCOUNTER — Encounter: Payer: Self-pay | Admitting: Family Medicine

## 2015-09-09 ENCOUNTER — Other Ambulatory Visit: Payer: Self-pay | Admitting: Family Medicine

## 2015-09-11 NOTE — Telephone Encounter (Signed)
Pt has appt sch for 12/30. Do you want to OK RFs until then?

## 2015-09-26 ENCOUNTER — Other Ambulatory Visit: Payer: Self-pay

## 2015-09-26 NOTE — Telephone Encounter (Signed)
Patient will run out of medication listed below by the end of this week.   Patient has an appt with Dr. Katrinka BlazingSmith on 12/08/2015 @ 11 am and would like to see if her medication can be refilled up until her appt date.   FLUoxetine (PROZAC) 20 MG tablet [[id number]][[id number]]   Pharmacy:  [id number] - [place], [place] - [address] [facility] [address]  Pt # [phone]

## 2015-09-27 ENCOUNTER — Telehealth: Payer: Self-pay

## 2015-09-27 MED ORDER — FLUOXETINE HCL 20 MG PO TABS: 20.0000 mg | ORAL_TABLET | Freq: Every day | ORAL | Status: DC

## 2015-09-27 NOTE — Telephone Encounter (Signed)
Left vmail. Patient has been prescribed prozac since last year and I do not see any continued use or eval. Should RTC to have rx filled.

## 2015-09-27 NOTE — Telephone Encounter (Signed)
Dr. Katrinka BlazingSmith, this medication was denied and pt was told to RTC. Can you advise?

## 2015-09-27 NOTE — Telephone Encounter (Signed)
Refill for two months provided.

## 2015-09-27 NOTE — Telephone Encounter (Signed)
Can we send in 90 day supply?

## 2015-09-27 NOTE — Telephone Encounter (Signed)
Patient is calling back in regards to getting a refill for prozac. Patient states that the earliest she was able to get appointment with Dr. Katrinka BlazingSmith is December and would like to know if she could maybe get enough to last until then. Please call patient! [phone]

## 2015-09-28 NOTE — Telephone Encounter (Signed)
Advised Rx sent

## 2015-10-02 ENCOUNTER — Telehealth: Payer: Self-pay | Admitting: Family Medicine

## 2015-10-02 NOTE — Telephone Encounter (Signed)
lmom to call back and reschedule her appt that she had with Dr Katrinka BlazingSmith 12/08/15 for an OV

## 2015-11-12 ENCOUNTER — Other Ambulatory Visit: Payer: Self-pay | Admitting: Family Medicine

## 2015-12-06 ENCOUNTER — Ambulatory Visit: Payer: BC Managed Care – PPO | Admitting: Family Medicine

## 2015-12-08 ENCOUNTER — Ambulatory Visit: Payer: BC Managed Care – PPO | Admitting: Family Medicine

## 2015-12-15 ENCOUNTER — Other Ambulatory Visit: Payer: Self-pay | Admitting: Family Medicine

## 2015-12-22 ENCOUNTER — Telehealth: Payer: Self-pay | Admitting: Family Medicine

## 2015-12-22 DIAGNOSIS — Z1211 Encounter for screening for malignant neoplasm of colon: Secondary | ICD-10-CM

## 2015-12-22 NOTE — Telephone Encounter (Signed)
Patient returned phone call to schedule colonoscopy.  She wanted it scheduled in  April during her Spring Break

## 2015-12-22 NOTE — Telephone Encounter (Signed)
LEFT A MESSAGE FOR PATIENT TO RETURN CALL TO SCHEDULE APPOINTMENT FOR A COLONOSCOPY.

## 2015-12-25 ENCOUNTER — Encounter: Payer: Self-pay | Admitting: Family Medicine

## 2015-12-25 ENCOUNTER — Ambulatory Visit (INDEPENDENT_AMBULATORY_CARE_PROVIDER_SITE_OTHER): Payer: BC Managed Care – PPO | Admitting: Family Medicine

## 2015-12-25 VITALS — BP 124/82 | HR 87 | Temp 97.8°F | Resp 18 | Wt 199.0 lb

## 2015-12-25 DIAGNOSIS — F32A Depression, unspecified: Secondary | ICD-10-CM

## 2015-12-25 DIAGNOSIS — N938 Other specified abnormal uterine and vaginal bleeding: Secondary | ICD-10-CM | POA: Diagnosis not present

## 2015-12-25 DIAGNOSIS — F419 Anxiety disorder, unspecified: Principal | ICD-10-CM

## 2015-12-25 DIAGNOSIS — Z23 Encounter for immunization: Secondary | ICD-10-CM

## 2015-12-25 DIAGNOSIS — G4710 Hypersomnia, unspecified: Secondary | ICD-10-CM | POA: Diagnosis not present

## 2015-12-25 DIAGNOSIS — F418 Other specified anxiety disorders: Secondary | ICD-10-CM

## 2015-12-25 DIAGNOSIS — E669 Obesity, unspecified: Secondary | ICD-10-CM | POA: Diagnosis not present

## 2015-12-25 DIAGNOSIS — Z803 Family history of malignant neoplasm of breast: Secondary | ICD-10-CM | POA: Diagnosis not present

## 2015-12-25 DIAGNOSIS — F329 Major depressive disorder, single episode, unspecified: Secondary | ICD-10-CM

## 2015-12-25 DIAGNOSIS — G43709 Chronic migraine without aura, not intractable, without status migrainosus: Secondary | ICD-10-CM | POA: Diagnosis not present

## 2015-12-25 DIAGNOSIS — Z1211 Encounter for screening for malignant neoplasm of colon: Secondary | ICD-10-CM | POA: Diagnosis not present

## 2015-12-25 LAB — CBC WITH DIFFERENTIAL/PLATELET
BASOS ABS: 0 10*3/uL (ref 0.0–0.1)
Basophils Relative: 0 % (ref 0–1)
EOS PCT: 1 % (ref 0–5)
Eosinophils Absolute: 0 10*3/uL (ref 0.0–0.7)
HEMATOCRIT: 37.2 % (ref 36.0–46.0)
Hemoglobin: 12.7 g/dL (ref 12.0–15.0)
LYMPHS PCT: 33 % (ref 12–46)
Lymphs Abs: 1.4 10*3/uL (ref 0.7–4.0)
MCH: 28.3 pg (ref 26.0–34.0)
MCHC: 34.1 g/dL (ref 30.0–36.0)
MCV: 82.9 fL (ref 78.0–100.0)
MPV: 9.8 fL (ref 8.6–12.4)
Monocytes Absolute: 0.5 10*3/uL (ref 0.1–1.0)
Monocytes Relative: 12 % (ref 3–12)
NEUTROS PCT: 54 % (ref 43–77)
Neutro Abs: 2.3 10*3/uL (ref 1.7–7.7)
PLATELETS: 271 10*3/uL (ref 150–400)
RBC: 4.49 MIL/uL (ref 3.87–5.11)
RDW: 14.6 % (ref 11.5–15.5)
WBC: 4.3 10*3/uL (ref 4.0–10.5)

## 2015-12-25 LAB — TSH: TSH: 0.656 u[IU]/mL (ref 0.350–4.500)

## 2015-12-25 LAB — COMPREHENSIVE METABOLIC PANEL
ALK PHOS: 38 U/L (ref 33–130)
ALT: 19 U/L (ref 6–29)
AST: 19 U/L (ref 10–35)
Albumin: 4.8 g/dL (ref 3.6–5.1)
BILIRUBIN TOTAL: 0.5 mg/dL (ref 0.2–1.2)
BUN: 9 mg/dL (ref 7–25)
CO2: 24 mmol/L (ref 20–31)
Calcium: 9.6 mg/dL (ref 8.6–10.4)
Chloride: 102 mmol/L (ref 98–110)
Creat: 0.77 mg/dL (ref 0.50–1.05)
GLUCOSE: 88 mg/dL (ref 65–99)
Potassium: 4.1 mmol/L (ref 3.5–5.3)
Sodium: 136 mmol/L (ref 135–146)
TOTAL PROTEIN: 7.6 g/dL (ref 6.1–8.1)

## 2015-12-25 LAB — VITAMIN B12: Vitamin B-12: 1149 pg/mL — ABNORMAL HIGH (ref 211–911)

## 2015-12-25 MED ORDER — FLUOXETINE HCL 20 MG PO TABS: 40.0000 mg | ORAL_TABLET | Freq: Every day | ORAL | Status: DC

## 2015-12-25 MED ORDER — SUMATRIPTAN SUCCINATE 100 MG PO TABS: 100.0000 mg | ORAL_TABLET | ORAL | Status: DC | PRN

## 2015-12-25 NOTE — Progress Notes (Signed)
Subjective:    Patient ID: Kendra Saunders, female    DOB: May 25, 1966, 50 y.o.   MRN: [id number]  12/25/2015  Medication Refill   HPI This 50 y.o. female presents for one year follow-up:  1.  DUB: IUD placed in 05/2015; bled for six months; had IUD removed; had tried Doxy in 10/2015 with IUD which worked temporarily.  Considering hysterectomy or ablation.  [place].  Not menopausal; s/p ultrasound pelvic.   Skyler did not work.  2.  Colon cancer screening: agreeable to scheduling colonoscopy.  3.  Anxiety and depression:  Gets sleepy driving into work.  Goes to bed at 9:30am; wakes up 5;15.  Does have second job where goes to bed at 11:00pm once per week.  Takes Prozac before going to work.  Falls sleep in 3 minutes.  During menses, mood worsens.  Takes two tablets daily most days; on weekends, takes one; may skip on Sunday.  [employer] was really crazy yesterday; stayed really calm.  No SI.  Gets agitated and irritable; short-tempered.    4.  Hypersomnolence:  Snores; falls asleep quickly within 3 minutes; no apnea witnessed.  Falls asleep easily while driving; not when watching television but does not watch a lot of television. No previous sleep study.   5.  Family history of breast cancer: s/p genetics testing completed; no genetic gene for breast cancer, colon cancer.    6.  Migraines: realized that sodium and processed foods trigger migraines.  Now getting 1-2 migraines per month; once every other month.  Misses work never.   Review of Systems  Constitutional: Negative for fever, chills, diaphoresis and fatigue.  Eyes: Negative for visual disturbance.  Respiratory: Negative for cough and shortness of breath.   Cardiovascular: Negative for chest pain, palpitations and leg swelling.  Gastrointestinal: Negative for nausea, vomiting, abdominal pain, diarrhea and constipation.  Endocrine: Negative for cold intolerance, heat intolerance, polydipsia, polyphagia and polyuria.  Neurological:  Negative for dizziness, tremors, seizures, syncope, facial asymmetry, speech difficulty, weakness, light-headedness, numbness and headaches.    Past Medical History  Diagnosis Date  . Anxiety   . Asthma     childhood; last Albuterol use elementary school.  . Migraine    Past Surgical History  Procedure Laterality Date  . Eye surgery      R orbital fracture  . Tubal ligation    . Ovarian cyst removal    . Tonsillectomy    . Hammer toe surgery     Allergies  Allergen Reactions  . Peanuts [Peanut Oil]     Throat itches and she has stomach pains   Current Outpatient Prescriptions  Medication Sig Dispense Refill  . FLUoxetine (PROZAC) 20 MG tablet Take 2 tablets (40 mg total) by mouth daily. 60 tablet 5  . SUMAtriptan (IMITREX) 100 MG tablet Take 1 tablet (100 mg total) by mouth every 2 (two) hours as needed for migraine or headache. May repeat in 2 hours if headache persists or recurs. 8 tablet 5  . ipratropium (ATROVENT) 0.03 % nasal spray Place 2 sprays into the nose 2 (two) times daily. (Patient not taking: Reported on 12/25/2015) 30 mL 0   No current facility-administered medications for this visit.   Social History   Social History  . Marital Status: Single    Spouse Name: n/a  . Number of Children: 2  . Years of Education: Master's    Occupational History  . [employer]   Social History Main Topics  . Smoking  status: Never Smoker   . Smokeless tobacco: Never Used  . Alcohol Use: Yes     Comment: rarely  . Drug Use: No  . Sexual Activity:    Partners: Male   Other Topics Concern  . Not on file   Social History Narrative   Marital status: divorced since 2002; +dating seriously x 7 years.      Children:  2 children (28, 20); no grandchildren.      Lives:  with her two children.      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      Tobacco: none       Alcohol: wine once per week.       Exercise:   Walking 3 days per week      Seatbelt: 100%      Guns: none      Sexual activity:  Total sexual partners = 20.  No STDs.  Last STD testing never.   Date males.     Family History  Problem Relation Age of Onset  . Cancer Mother 43    breast cancer  . Cancer Father 17    brain cancer; prostate cancer  . Heart disease Brother 75    heart attack  . Cancer Paternal Grandfather     breast cancer  . Lupus Daughter   . Cancer Sister     breast cancer  . Cancer Sister 78    breast cancer       Objective:    BP 124/82 mmHg  Pulse 87  Temp(Src) 97.8 F (36.6 C) (Oral)  Resp 18  Wt 199 lb (90.266 kg)  SpO2 98% Physical Exam  Constitutional: She is oriented to person, place, and time. She appears well-developed and well-nourished. No distress.  HENT:  Head: Normocephalic and atraumatic.  Right Ear: External ear normal.  Left Ear: External ear normal.  Nose: Nose normal.  Mouth/Throat: Oropharynx is clear and moist.  Eyes: Conjunctivae and EOM are normal. Pupils are equal, round, and reactive to light.  Neck: Normal range of motion. Neck supple. Carotid bruit is not present. No thyromegaly present.  Cardiovascular: Normal rate, regular rhythm, normal heart sounds and intact distal pulses.  Exam reveals no gallop and no friction rub.   No murmur heard. Pulmonary/Chest: Effort normal and breath sounds normal. She has no wheezes. She has no rales.  Abdominal: Soft. Bowel sounds are normal. She exhibits no distension and no mass. There is no tenderness. There is no rebound and no guarding.  Lymphadenopathy:    She has no cervical adenopathy.  Neurological: She is alert and oriented to person, place, and time. No cranial nerve deficit.  Skin: Skin is warm and dry. No rash noted. She is not diaphoretic. No erythema. No pallor.  Psychiatric: She has a normal mood and affect. Her behavior is normal.   Results for orders placed or performed in visit on 12/25/15  CBC with  Differential/Platelet  Result Value Ref Range   WBC 4.3 4.0 - 10.5 K/uL   RBC 4.49 3.87 - 5.11 MIL/uL   Hemoglobin 12.7 12.0 - 15.0 g/dL   HCT 37.2 36.0 - 46.0 %   MCV 82.9 78.0 - 100.0 fL   MCH 28.3 26.0 - 34.0 pg   MCHC 34.1 30.0 - 36.0 g/dL   RDW 14.6 11.5 - 15.5 %   Platelets 271 150 - 400 K/uL   MPV 9.8 8.6 - 12.4 fL   Neutrophils Relative %  54 43 - 77 %   Neutro Abs 2.3 1.7 - 7.7 K/uL   Lymphocytes Relative 33 12 - 46 %   Lymphs Abs 1.4 0.7 - 4.0 K/uL   Monocytes Relative 12 3 - 12 %   Monocytes Absolute 0.5 0.1 - 1.0 K/uL   Eosinophils Relative 1 0 - 5 %   Eosinophils Absolute 0.0 0.0 - 0.7 K/uL   Basophils Relative 0 0 - 1 %   Basophils Absolute 0.0 0.0 - 0.1 K/uL   Smear Review Criteria for review not met   Comprehensive metabolic panel  Result Value Ref Range   Sodium 136 135 - 146 mmol/L   Potassium 4.1 3.5 - 5.3 mmol/L   Chloride 102 98 - 110 mmol/L   CO2 24 20 - 31 mmol/L   Glucose, Bld 88 65 - 99 mg/dL   BUN 9 7 - 25 mg/dL   Creat 0.77 0.50 - 1.05 mg/dL   Total Bilirubin 0.5 0.2 - 1.2 mg/dL   Alkaline Phosphatase 38 33 - 130 U/L   AST 19 10 - 35 U/L   ALT 19 6 - 29 U/L   Total Protein 7.6 6.1 - 8.1 g/dL   Albumin 4.8 3.6 - 5.1 g/dL   Calcium 9.6 8.6 - 10.4 mg/dL  TSH  Result Value Ref Range   TSH 0.656 0.350 - 4.500 uIU/mL  Vitamin B12  Result Value Ref Range   Vitamin B-12 1149 (H) 211 - 911 pg/mL  VITAMIN D 25 Hydroxy (Vit-D Deficiency, Fractures)  Result Value Ref Range   Vit D, 25-Hydroxy 28 (L) 30 - 100 ng/mL       Assessment & Plan:   1. Anxiety and depression   2. DUB (dysfunctional uterine bleeding)   3. Need for Tdap vaccination   4. Colon cancer screening   5. Hypersomnolence   6. Obesity     Orders Placed This Encounter  Procedures  . Tdap vaccine greater than or equal to 7yo IM  . CBC with Differential/Platelet  . Comprehensive metabolic panel  . TSH  . Vitamin B12  . VITAMIN D 25 Hydroxy (Vit-D Deficiency, Fractures)    Meds ordered this encounter  Medications  . FLUoxetine (PROZAC) 20 MG tablet    Sig: Take 2 tablets (40 mg total) by mouth daily.    Dispense:  60 tablet    Refill:  5  . SUMAtriptan (IMITREX) 100 MG tablet    Sig: Take 1 tablet (100 mg total) by mouth every 2 (two) hours as needed for migraine or headache. May repeat in 2 hours if headache persists or recurs.    Dispense:  8 tablet    Refill:  5    Return in about 6 months (around 06/23/2016) for recheck.    Torrence Branagan Elayne Guerin, M.D. [facility] [address] [place], [place]  [address] [phone] phone [phone] fax

## 2015-12-26 LAB — VITAMIN D 25 HYDROXY (VIT D DEFICIENCY, FRACTURES): Vit D, 25-Hydroxy: 28 ng/mL — ABNORMAL LOW (ref 30–100)

## 2016-01-09 DIAGNOSIS — F329 Major depressive disorder, single episode, unspecified: Secondary | ICD-10-CM | POA: Insufficient documentation

## 2016-01-09 DIAGNOSIS — G4710 Hypersomnia, unspecified: Secondary | ICD-10-CM | POA: Insufficient documentation

## 2016-01-09 DIAGNOSIS — E669 Obesity, unspecified: Secondary | ICD-10-CM | POA: Insufficient documentation

## 2016-01-09 DIAGNOSIS — F419 Anxiety disorder, unspecified: Principal | ICD-10-CM

## 2016-01-09 DIAGNOSIS — N938 Other specified abnormal uterine and vaginal bleeding: Secondary | ICD-10-CM | POA: Insufficient documentation

## 2016-01-09 DIAGNOSIS — Z803 Family history of malignant neoplasm of breast: Secondary | ICD-10-CM | POA: Insufficient documentation

## 2016-01-22 ENCOUNTER — Other Ambulatory Visit: Payer: Self-pay | Admitting: Family Medicine

## 2016-02-23 ENCOUNTER — Other Ambulatory Visit: Payer: Self-pay | Admitting: Family Medicine

## 2016-02-24 NOTE — Telephone Encounter (Signed)
Is she out of imitrex?  How often is she having to take?

## 2016-05-31 ENCOUNTER — Encounter: Payer: Self-pay | Admitting: Gastroenterology

## 2016-06-25 ENCOUNTER — Ambulatory Visit (INDEPENDENT_AMBULATORY_CARE_PROVIDER_SITE_OTHER): Payer: BC Managed Care – PPO | Admitting: Family Medicine

## 2016-06-25 ENCOUNTER — Encounter: Payer: Self-pay | Admitting: Family Medicine

## 2016-06-25 VITALS — BP 110/72 | HR 79 | Temp 99.0°F | Resp 16 | Ht 70.5 in | Wt 205.4 lb

## 2016-06-25 DIAGNOSIS — E559 Vitamin D deficiency, unspecified: Secondary | ICD-10-CM

## 2016-06-25 DIAGNOSIS — N938 Other specified abnormal uterine and vaginal bleeding: Secondary | ICD-10-CM | POA: Diagnosis not present

## 2016-06-25 DIAGNOSIS — F329 Major depressive disorder, single episode, unspecified: Secondary | ICD-10-CM

## 2016-06-25 DIAGNOSIS — G43009 Migraine without aura, not intractable, without status migrainosus: Secondary | ICD-10-CM | POA: Diagnosis not present

## 2016-06-25 DIAGNOSIS — F418 Other specified anxiety disorders: Secondary | ICD-10-CM | POA: Diagnosis not present

## 2016-06-25 DIAGNOSIS — E663 Overweight: Secondary | ICD-10-CM

## 2016-06-25 DIAGNOSIS — B373 Candidiasis of vulva and vagina: Secondary | ICD-10-CM | POA: Diagnosis not present

## 2016-06-25 DIAGNOSIS — F419 Anxiety disorder, unspecified: Principal | ICD-10-CM

## 2016-06-25 DIAGNOSIS — Z803 Family history of malignant neoplasm of breast: Secondary | ICD-10-CM | POA: Diagnosis not present

## 2016-06-25 DIAGNOSIS — B3731 Acute candidiasis of vulva and vagina: Secondary | ICD-10-CM

## 2016-06-25 LAB — CBC WITH DIFFERENTIAL/PLATELET
BASOS ABS: 0 {cells}/uL (ref 0–200)
Basophils Relative: 0 %
EOS ABS: 82 {cells}/uL (ref 15–500)
Eosinophils Relative: 2 %
HEMATOCRIT: 37.5 % (ref 35.0–45.0)
HEMOGLOBIN: 12.8 g/dL (ref 11.7–15.5)
LYMPHS ABS: 1271 {cells}/uL (ref 850–3900)
Lymphocytes Relative: 31 %
MCH: 27.6 pg (ref 27.0–33.0)
MCHC: 34.1 g/dL (ref 32.0–36.0)
MCV: 80.8 fL (ref 80.0–100.0)
MPV: 9.7 fL (ref 7.5–12.5)
Monocytes Absolute: 615 cells/uL (ref 200–950)
Monocytes Relative: 15 %
NEUTROS ABS: 2132 {cells}/uL (ref 1500–7800)
Neutrophils Relative %: 52 %
Platelets: 356 10*3/uL (ref 140–400)
RBC: 4.64 MIL/uL (ref 3.80–5.10)
RDW: 14 % (ref 11.0–15.0)
WBC: 4.1 10*3/uL (ref 3.8–10.8)

## 2016-06-25 MED ORDER — RIZATRIPTAN BENZOATE 10 MG PO TBDP: 10.0000 mg | ORAL_TABLET | ORAL | Status: DC | PRN

## 2016-06-25 MED ORDER — FLUOXETINE HCL 20 MG PO TABS: 40.0000 mg | ORAL_TABLET | Freq: Every day | ORAL | Status: DC

## 2016-06-25 MED ORDER — FLUCONAZOLE 150 MG PO TABS: 150.0000 mg | ORAL_TABLET | Freq: Once | ORAL | Status: DC

## 2016-06-25 NOTE — Patient Instructions (Signed)
     IF you received an x-ray today, you will receive an invoice from [facility]. Please contact [facility] at [phone] with questions or concerns regarding your invoice.   IF you received labwork today, you will receive an invoice from [facility]. Please contact [facility] at [phone] with questions or concerns regarding your invoice.   Our billing staff will not be able to assist you with questions regarding bills from these companies.  You will be contacted with the lab results as soon as they are available. The fastest way to get your results is to activate your My Chart account. Instructions are located on the last page of this paperwork. If you have not heard from us regarding the results in 2 weeks, please contact this office.      

## 2016-06-25 NOTE — Progress Notes (Signed)
Subjective:    Patient ID: Kendra Saunders, female    DOB: Sep 05, 1966, 49 y.o.   MRN: [id number]  06/25/2016  Follow-up (Depression, Anxiety)   HPI This 50 y.o. female presents for six month follow-up:   1.  Anxiety and depression: doing well.  Taking Fluoxetine two daily.  Working well.  Just switched to taking at night two weeks ago.  2. Migraines: prescribed Imitrex at last visit.  Start with one Imitrex; then two hours later, will take second Imitrex.  Then take Advil afterwards.  +nausea; +grumpy; +photophobia.  Loud noises does not bother patient.  Getting 2 migraines per month; never misses work.   No dizziness/numbness/tingling.  No vision changes.   3. DUB: s/p IUD removed in 12/2015; now irregular menses.  No further procedures.  No hysterectomy.  Just finished menses this week.  Appointment not scheduled.   4. S/p dental procedure: Amoxicillin prescribed; starting to itching vaginal area; hydrocodone also prescribed.  5. Colon cancer screening: scheduled for consultation this week.  Mammogram scheduled for 07/12/16.  Colonoscopy 07/11/16.  6. Vitamin D deficiency;  Review of Systems  Constitutional: Negative for fever, chills, diaphoresis and fatigue.  HENT: Positive for dental problem.   Eyes: Negative for visual disturbance.  Respiratory: Negative for cough and shortness of breath.   Cardiovascular: Negative for chest pain, palpitations and leg swelling.  Gastrointestinal: Negative for nausea, vomiting, abdominal pain, diarrhea and constipation.  Endocrine: Negative for cold intolerance, heat intolerance, polydipsia, polyphagia and polyuria.  Genitourinary: Positive for vaginal discharge. Negative for vaginal pain.  Neurological: Positive for headaches. Negative for dizziness, tremors, seizures, syncope, facial asymmetry, speech difficulty, weakness, light-headedness and numbness.  Psychiatric/Behavioral: Negative for dysphoric mood, self-injury, sleep disturbance and suicidal  ideas. The patient is nervous/anxious.     Past Medical History:  Diagnosis Date  . Anxiety   . Asthma    childhood; last Albuterol use elementary school.  . Depression   . Migraine   . Seasonal allergies   . Sickle cell trait [facility])    Past Surgical History:  Procedure Laterality Date  . EYE SURGERY     R orbital fracture  . HAMMER TOE SURGERY    . OVARIAN CYST REMOVAL    . TONSILLECTOMY    . TUBAL LIGATION     Allergies  Allergen Reactions  . Peanuts [Peanut Oil]     Throat itches and she has stomach pains    Social History   Social History  . Marital status: Single    Spouse name: n/a  . Number of children: 2  . Years of education: Master's    Occupational History  . [employer]   Social History Main Topics  . Smoking status: Never Smoker  . Smokeless tobacco: Never Used  . Alcohol use Yes     Comment: rarely  . Drug use: No  . Sexual activity: Yes    Partners: Male   Other Topics Concern  . Not on file   Social History Narrative   Marital status: divorced since 2002; +dating seriously x 7 years.      Children:  2 children (28, 20); no grandchildren.      Lives:  with her two children.      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      Tobacco: none       Alcohol: wine once per week.       Exercise:  Walking 3 days  per week      Seatbelt: 100%      Guns: none      Sexual activity:  Total sexual partners = 20.  No STDs.  Last STD testing never.   Date males.     Family History  Problem Relation Age of Onset  . Cancer Mother 74    breast cancer  . Cancer Father 61    brain cancer; prostate cancer  . Heart disease Brother 54    heart attack  . Cancer Paternal Grandfather     breast cancer  . Lupus Daughter   . Cancer Sister     breast cancer  . Cancer Sister 74    breast cancer  . Colon cancer Neg Hx        Objective:    BP 110/72   Pulse 79   Temp 99 F (37.2 C) (Oral)    Resp 16   Ht 5' 10.5" (1.791 m)   Wt 205 lb 6.4 oz (93.2 kg)   LMP 06/10/2016   SpO2 99%   BMI 29.06 kg/m   Physical Exam  Constitutional: She is oriented to person, place, and time. She appears well-developed and well-nourished. No distress.  HENT:  Head: Normocephalic and atraumatic.  Right Ear: External ear normal.  Left Ear: External ear normal.  Nose: Nose normal.  Mouth/Throat: Oropharynx is clear and moist.  Eyes: Conjunctivae and EOM are normal. Pupils are equal, round, and reactive to light.  Neck: Normal range of motion. Neck supple. Carotid bruit is not present. No thyromegaly present.  Cardiovascular: Normal rate, regular rhythm, normal heart sounds and intact distal pulses.  Exam reveals no gallop and no friction rub.   No murmur heard. Pulmonary/Chest: Effort normal and breath sounds normal. She has no wheezes. She has no rales.  Abdominal: Soft. Bowel sounds are normal. She exhibits no distension and no mass. There is no tenderness. There is no rebound and no guarding.  Lymphadenopathy:    She has no cervical adenopathy.  Neurological: She is alert and oriented to person, place, and time. No cranial nerve deficit.  Skin: Skin is warm and dry. No rash noted. She is not diaphoretic. No erythema. No pallor.  Psychiatric: She has a normal mood and affect. Her behavior is normal.   Results for orders placed or performed in visit on 06/25/16  CBC with Differential/Platelet  Result Value Ref Range   WBC 4.1 3.8 - 10.8 K/uL   RBC 4.64 3.80 - 5.10 MIL/uL   Hemoglobin 12.8 11.7 - 15.5 g/dL   HCT 37.5 35.0 - 45.0 %   MCV 80.8 80.0 - 100.0 fL   MCH 27.6 27.0 - 33.0 pg   MCHC 34.1 32.0 - 36.0 g/dL   RDW 14.0 11.0 - 15.0 %   Platelets 356 140 - 400 K/uL   MPV 9.7 7.5 - 12.5 fL   Neutro Abs 2,132 1,500 - 7,800 cells/uL   Lymphs Abs 1,271 850 - 3,900 cells/uL   Monocytes Absolute 615 200 - 950 cells/uL   Eosinophils Absolute 82 15 - 500 cells/uL   Basophils Absolute 0 0 -  200 cells/uL   Neutrophils Relative % 52 %   Lymphocytes Relative 31 %   Monocytes Relative 15 %   Eosinophils Relative 2 %   Basophils Relative 0 %   Smear Review Criteria for review not met   Comprehensive metabolic panel  Result Value Ref Range   Sodium 137 135 - 146 mmol/L  Potassium 4.5 3.5 - 5.3 mmol/L   Chloride 104 98 - 110 mmol/L   CO2 24 20 - 31 mmol/L   Glucose, Bld 90 65 - 99 mg/dL   BUN 7 7 - 25 mg/dL   Creat 0.87 0.50 - 1.05 mg/dL   Total Bilirubin 0.6 0.2 - 1.2 mg/dL   Alkaline Phosphatase 44 33 - 130 U/L   AST 22 10 - 35 U/L   ALT 20 6 - 29 U/L   Total Protein 7.6 6.1 - 8.1 g/dL   Albumin 4.7 3.6 - 5.1 g/dL   Calcium 9.7 8.6 - 10.4 mg/dL  VITAMIN D 25 Hydroxy (Vit-D Deficiency, Fractures)  Result Value Ref Range   Vit D, 25-Hydroxy 28 (L) 30 - 100 ng/mL       Assessment & Plan:   1. Anxiety and depression   2. Vitamin D deficiency   3. Dysfunctional uterine bleeding   4. Migraine without aura and without status migrainosus, not intractable   5. Overweight (BMI 25.0-29.9)   6. Family history of breast cancer in mother   69. Vaginal candidiasis     Orders Placed This Encounter  Procedures  . CBC with Differential/Platelet  . Comprehensive metabolic panel  . VITAMIN D 25 Hydroxy (Vit-D Deficiency, Fractures)   Meds ordered this encounter  Medications  . DISCONTD: amoxicillin (AMOXIL) 500 MG capsule    Sig: Take 500 mg by mouth 3 (three) times daily.  Marland Kitchen HYDROcodone-acetaminophen (NORCO/VICODIN) 5-325 MG tablet    Sig: Take 1 tablet by mouth every 4 (four) hours as needed for moderate pain.  . rizatriptan (MAXALT-MLT) 10 MG disintegrating tablet    Sig: Take 1 tablet (10 mg total) by mouth as needed for migraine. May repeat in 2 hours if needed    Dispense:  8 tablet    Refill:  3  . DISCONTD: FLUoxetine (PROZAC) 20 MG tablet    Sig: Take 2 tablets (40 mg total) by mouth daily.    Dispense:  60 tablet    Refill:  5  . fluconazole (DIFLUCAN) 150  MG tablet    Sig: Take 1 tablet (150 mg total) by mouth once. Repeat if needed    Dispense:  2 tablet    Refill:  0  . FLUoxetine (PROZAC) 20 MG tablet    Sig: Take 2-3 tablets (40-60 mg total) by mouth daily.    Dispense:  75 tablet    Refill:  5    Return in about 6 months (around 12/26/2016) for recheck.    Suzy Kugel Elayne Guerin, M.D. [facility] [address] [place], [place]  [address] [phone] phone [phone] fax

## 2016-06-26 LAB — COMPREHENSIVE METABOLIC PANEL
ALBUMIN: 4.7 g/dL (ref 3.6–5.1)
ALT: 20 U/L (ref 6–29)
AST: 22 U/L (ref 10–35)
Alkaline Phosphatase: 44 U/L (ref 33–130)
BUN: 7 mg/dL (ref 7–25)
CALCIUM: 9.7 mg/dL (ref 8.6–10.4)
CHLORIDE: 104 mmol/L (ref 98–110)
CO2: 24 mmol/L (ref 20–31)
CREATININE: 0.87 mg/dL (ref 0.50–1.05)
Glucose, Bld: 90 mg/dL (ref 65–99)
POTASSIUM: 4.5 mmol/L (ref 3.5–5.3)
Sodium: 137 mmol/L (ref 135–146)
TOTAL PROTEIN: 7.6 g/dL (ref 6.1–8.1)
Total Bilirubin: 0.6 mg/dL (ref 0.2–1.2)

## 2016-06-26 LAB — VITAMIN D 25 HYDROXY (VIT D DEFICIENCY, FRACTURES): VIT D 25 HYDROXY: 28 ng/mL — AB (ref 30–100)

## 2016-06-27 ENCOUNTER — Encounter: Payer: Self-pay | Admitting: Gastroenterology

## 2016-06-27 ENCOUNTER — Ambulatory Visit: Payer: BC Managed Care – PPO

## 2016-06-27 VITALS — Ht 70.0 in | Wt 210.6 lb

## 2016-06-27 DIAGNOSIS — Z1211 Encounter for screening for malignant neoplasm of colon: Secondary | ICD-10-CM

## 2016-06-27 MED ORDER — SUPREP BOWEL PREP KIT 17.5-3.13-1.6 GM/177ML PO SOLN
1.0000 | Freq: Once | ORAL | Status: DC
Start: 2016-06-27 — End: 2016-07-11

## 2016-06-27 NOTE — Progress Notes (Signed)
No allergies to eggs or soy No past problems with anesthesia No diet meds No home oxygen  Has email and internet registered for emmi 

## 2016-07-11 ENCOUNTER — Ambulatory Visit (AMBULATORY_SURGERY_CENTER): Payer: BC Managed Care – PPO | Admitting: Gastroenterology

## 2016-07-11 ENCOUNTER — Encounter: Payer: Self-pay | Admitting: Gastroenterology

## 2016-07-11 VITALS — BP 138/83 | HR 70 | Temp 96.8°F | Resp 17 | Ht 70.0 in | Wt 210.0 lb

## 2016-07-11 DIAGNOSIS — Z1211 Encounter for screening for malignant neoplasm of colon: Secondary | ICD-10-CM | POA: Diagnosis present

## 2016-07-11 MED ORDER — SODIUM CHLORIDE 0.9 % IV SOLN: 500.0000 mL | INTRAVENOUS | Status: AC

## 2016-07-11 NOTE — Patient Instructions (Signed)
Hemorrhoids seen today, handout given. Repeat colonoscopy in 10 years. Resume current medications. Call us with any questions or concerns. Thank you!   YOU HAD AN ENDOSCOPIC PROCEDURE TODAY AT THE [facility]:   Refer to the procedure report that was given to you for any specific questions about what was found during the examination.  If the procedure report does not answer your questions, please call your gastroenterologist to clarify.  If you requested that your care partner not be given the details of your procedure findings, then the procedure report has been included in a sealed envelope for you to review at your convenience later.  YOU SHOULD EXPECT: Some feelings of bloating in the abdomen. Passage of more gas than usual.  Walking can help get rid of the air that was put into your GI tract during the procedure and reduce the bloating. If you had a lower endoscopy (such as a colonoscopy or flexible sigmoidoscopy) you may notice spotting of blood in your stool or on the toilet paper. If you underwent a bowel prep for your procedure, you may not have a normal bowel movement for a few days.  Please Note:  You might notice some irritation and congestion in your nose or some drainage.  This is from the oxygen used during your procedure.  There is no need for concern and it should clear up in a day or so.  SYMPTOMS TO REPORT IMMEDIATELY:   Following lower endoscopy (colonoscopy or flexible sigmoidoscopy):  Excessive amounts of blood in the stool  Significant tenderness or worsening of abdominal pains  Swelling of the abdomen that is new, acute  Fever of 100F or higher    For urgent or emergent issues, a gastroenterologist can be reached at any hour by calling [phone].   DIET: Your first meal following the procedure should be a small meal and then it is ok to progress to your normal diet. Heavy or fried foods are harder to digest and may make you feel nauseous or bloated.   Likewise, meals heavy in dairy and vegetables can increase bloating.  Drink plenty of fluids but you should avoid alcoholic beverages for 24 hours.  ACTIVITY:  You should plan to take it easy for the rest of today and you should NOT DRIVE or use heavy machinery until tomorrow (because of the sedation medicines used during the test).    FOLLOW UP: Our staff will call the number listed on your records the next business day following your procedure to check on you and address any questions or concerns that you may have regarding the information given to you following your procedure. If we do not reach you, we will leave a message.  However, if you are feeling well and you are not experiencing any problems, there is no need to return our call.  We will assume that you have returned to your regular daily activities without incident.  If any biopsies were taken you will be contacted by phone or by letter within the next 1-3 weeks.  Please call us at [phone] if you have not heard about the biopsies in 3 weeks.    SIGNATURES/CONFIDENTIALITY: You and/or your care partner have signed paperwork which will be entered into your electronic medical record.  These signatures attest to the fact that that the information above on your After Visit Summary has been reviewed and is understood.  Full responsibility of the confidentiality of this discharge information lies with you and/or your care-partner. 

## 2016-07-11 NOTE — Progress Notes (Signed)
A/ox3 pleased with MAC, report to Robbin RN 

## 2016-07-11 NOTE — Op Note (Signed)
[facility] Patient Name: Kendra Saunders Procedure Date: 07/11/2016 9:49 AM MRN: [id number] Endoscopist: Napoleon Form , MD Age: 50 Referring MD:  Date of Birth: Nov 26, 1966 Gender: Female Account #: [id number] Procedure:                Colonoscopy Indications:              Screening for colorectal malignant neoplasm, This                            is the patient's first colonoscopy Medicines:                Monitored Anesthesia Care Procedure:                Pre-Anesthesia Assessment:                           - Prior to the procedure, a History and Physical                            was performed, and patient medications and                            allergies were reviewed. The patient's tolerance of                            previous anesthesia was also reviewed. The risks                            and benefits of the procedure and the sedation                            options and risks were discussed with the patient.                            All questions were answered, and informed consent                            was obtained. Prior Anticoagulants: The patient has                            taken no previous anticoagulant or antiplatelet                            agents. ASA Grade Assessment: II - A patient with                            mild systemic disease. After reviewing the risks                            and benefits, the patient was deemed in                            satisfactory condition to undergo the procedure.  After obtaining informed consent, the colonoscope                            was passed under direct vision. Throughout the                            procedure, the patient's blood pressure, pulse, and                            oxygen saturations were monitored continuously. The                            Model CF-HQ190L [id number]) scope was introduced                            through the anus and  advanced to the the terminal                            ileum, with identification of the appendiceal                            orifice and IC valve. The colonoscopy was performed                            without difficulty. The patient tolerated the                            procedure well. The quality of the bowel                            preparation was good. The terminal ileum, ileocecal                            valve, appendiceal orifice, and rectum were                            photographed. Scope In: 9:54:41 AM Scope Out: 10:17:25 AM Scope Withdrawal Time: 0 hours 15 minutes 31 seconds  Total Procedure Duration: 0 hours 22 minutes 44 seconds  Findings:                 The perianal and digital rectal examinations were                            normal.                           Non-bleeding internal hemorrhoids were found during                            retroflexion. The hemorrhoids were medium-sized.                           The exam was otherwise without abnormality. Complications:            No immediate complications. Estimated Blood  Loss:     Estimated blood loss: none. Impression:               - Non-bleeding internal hemorrhoids.                           - The examination was otherwise normal.                           - No specimens collected. Recommendation:           - Patient has a contact number available for                            emergencies. The signs and symptoms of potential                            delayed complications were discussed with the                            patient. Return to normal activities tomorrow.                            Written discharge instructions were provided to the                            patient.                           - Resume previous diet.                           - Continue present medications.                           - Repeat colonoscopy in 10 years for screening                             purposes.                           - Return to GI clinic PRN. Napoleon Form, MD 07/11/2016 10:21:43 AM This report has been signed electronically.

## 2016-07-12 ENCOUNTER — Telehealth: Payer: Self-pay | Admitting: *Deleted

## 2016-07-12 LAB — HM MAMMOGRAPHY: HM MAMMO: NORMAL (ref 0–4)

## 2016-07-12 NOTE — Telephone Encounter (Signed)
No answer, message left for the patient. 

## 2016-08-02 ENCOUNTER — Encounter: Payer: Self-pay | Admitting: *Deleted

## 2016-09-21 ENCOUNTER — Other Ambulatory Visit: Payer: Self-pay | Admitting: Family Medicine

## 2016-09-21 NOTE — Telephone Encounter (Signed)
06/25/2016 last ov 11/2016 next scheduled

## 2016-11-15 ENCOUNTER — Other Ambulatory Visit: Payer: Self-pay | Admitting: Family Medicine

## 2016-11-16 NOTE — Telephone Encounter (Signed)
06/25/16 last visit  12/03/16 next visit  Ok to refill per protocol

## 2016-12-03 ENCOUNTER — Ambulatory Visit (INDEPENDENT_AMBULATORY_CARE_PROVIDER_SITE_OTHER): Payer: BC Managed Care – PPO | Admitting: Family Medicine

## 2016-12-03 ENCOUNTER — Encounter: Payer: Self-pay | Admitting: Family Medicine

## 2016-12-03 VITALS — BP 130/85 | HR 82 | Temp 98.6°F | Resp 16 | Ht 70.0 in | Wt 213.0 lb

## 2016-12-03 DIAGNOSIS — G4710 Hypersomnia, unspecified: Secondary | ICD-10-CM | POA: Diagnosis not present

## 2016-12-03 DIAGNOSIS — Z803 Family history of malignant neoplasm of breast: Secondary | ICD-10-CM | POA: Diagnosis not present

## 2016-12-03 DIAGNOSIS — F418 Other specified anxiety disorders: Secondary | ICD-10-CM

## 2016-12-03 DIAGNOSIS — G43009 Migraine without aura, not intractable, without status migrainosus: Secondary | ICD-10-CM

## 2016-12-03 DIAGNOSIS — F32A Depression, unspecified: Secondary | ICD-10-CM

## 2016-12-03 DIAGNOSIS — F329 Major depressive disorder, single episode, unspecified: Secondary | ICD-10-CM

## 2016-12-03 DIAGNOSIS — E559 Vitamin D deficiency, unspecified: Secondary | ICD-10-CM

## 2016-12-03 DIAGNOSIS — R5383 Other fatigue: Secondary | ICD-10-CM

## 2016-12-03 DIAGNOSIS — Z13220 Encounter for screening for lipoid disorders: Secondary | ICD-10-CM | POA: Diagnosis not present

## 2016-12-03 DIAGNOSIS — N938 Other specified abnormal uterine and vaginal bleeding: Secondary | ICD-10-CM

## 2016-12-03 DIAGNOSIS — F419 Anxiety disorder, unspecified: Secondary | ICD-10-CM

## 2016-12-03 MED ORDER — RIZATRIPTAN BENZOATE 10 MG PO TBDP: 10.0000 mg | ORAL_TABLET | ORAL | 3 refills | Status: DC | PRN

## 2016-12-03 MED ORDER — FLUOXETINE HCL 20 MG PO TABS: ORAL_TABLET | ORAL | 5 refills | Status: DC

## 2016-12-03 MED ORDER — RIZATRIPTAN BENZOATE 10 MG PO TBDP
10.0000 mg | ORAL_TABLET | ORAL | 3 refills | Status: DC | PRN
Start: 2016-12-03 — End: 2016-12-03

## 2016-12-03 NOTE — Patient Instructions (Signed)
     IF you received an x-ray today, you will receive an invoice from [facility]. Please contact [facility] at [phone] with questions or concerns regarding your invoice.   IF you received labwork today, you will receive an invoice from [facility]. Please contact [facility] at [phone] with questions or concerns regarding your invoice.   Our billing staff will not be able to assist you with questions regarding bills from these companies.  You will be contacted with the lab results as soon as they are available. The fastest way to get your results is to activate your My Chart account. Instructions are located on the last page of this paperwork. If you have not heard from us regarding the results in 2 weeks, please contact this office.     

## 2016-12-03 NOTE — Progress Notes (Signed)
Subjective:    Patient ID: Kendra Saunders, female    DOB: 03-16-1966, 50 y.o.   MRN: [id number]  12/03/2016  Follow-up   HPI This 50 y.o. female presents for evaluation of anxiety/depression and migraines and vitamin D deficiency.  Always tired; getting plenty of sleep.  Worried about Prozac causing weight gain.  No energy to work out; works at [employer].  Has not worked out at [employer] in 2 years.  Snores; no apnea; no sleep study.  No napping.  Falls asleep easily if in the car.  Can watch television; can read a book.  Bedtime 10:00p; wakes up 5:30a.  Weekends sleeps in until 7:30am; bedtime same on weekends.  No insomnia.  Can drink coffee and still go fast asleep.  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  State of [place] expects students to take test at 9th grade reading level and many students at 4th grade level.  Teaching since 2006.   Seems to be worsening; really sad; no connections.  No one reads the newspaper; no one reads the news.   Gets relief when works at [employer]; 10 hours per week; adult conversations.  Kids are grown; daughter graduated from [employer]; has a job in her Engineer, maintenance (IT); double major Engineer, agricultural of the [place].   No anxiety or sadness at this time.   Less irritable; more tolerable with age.  No SI.   S/p colonoscopy in 07/2016; s/p mammogram 07/2016.  S/p genetic testing in 2016; no BRCA gene.  Itches a lot.   Migraines: Maxalt working really well.    Had a horrible period in 07/2016; then next menses in November 2017.    Last gynecological exam/yearly exam Kendra Saunders very recently 09/2016.    Immunization History  Administered Date(s) Administered  . Influenza Split 10/10/2015, 10/19/2016  . Influenza,inj,Quad PF,36+ Mos 09/18/2013, 10/19/2014  . Tdap 12/25/2015   BP Readings from Last 3 Encounters:  12/03/16 130/85  07/11/16 138/83  06/25/16 110/72   Wt  Readings from Last 3 Encounters:  12/03/16 213 lb (96.6 kg)  07/11/16 210 lb (95.3 kg)  06/27/16 210 lb 9.6 oz (95.5 kg)   Depression screen [facility] 2/9 12/03/2016 06/25/2016 12/25/2015 10/19/2014 07/18/2014  Decreased Interest 0 0 0 0 0  Down, Depressed, Hopeless 0 0 0 0 0  PHQ - 2 Score 0 0 0 0 0  Altered sleeping 0 - - - -  Tired, decreased energy 2 - - - -  Change in appetite 0 - - - -  Feeling bad or failure about yourself  0 - - - -  Trouble concentrating 0 - - - -  Moving slowly or fidgety/restless 0 - - - -  Suicidal thoughts 0 - - - -  PHQ-9 Score 2 - - - -  Difficult doing work/chores Not difficult at all - - - -      Review of Systems  Constitutional: Positive for fatigue. Negative for chills, diaphoresis and fever.  Eyes: Negative for visual disturbance.  Respiratory: Negative for cough and shortness of breath.   Cardiovascular: Negative for chest pain, palpitations and leg swelling.  Gastrointestinal: Negative for abdominal pain, constipation, diarrhea, nausea and vomiting.  Endocrine: Negative for cold intolerance, heat intolerance, polydipsia, polyphagia and polyuria.  Neurological: Negative for dizziness, tremors, seizures, syncope, facial asymmetry, speech difficulty, weakness, light-headedness, numbness and headaches.  Psychiatric/Behavioral: Positive for dysphoric mood. Negative for sleep disturbance.  The patient is nervous/anxious.     Past Medical History:  Diagnosis Date  . Anxiety   . Asthma    childhood; last Albuterol use elementary school.  . Depression   . Migraine   . Seasonal allergies   . Sickle cell trait [facility])    Past Surgical History:  Procedure Laterality Date  . EYE SURGERY     R orbital fracture  . HAMMER TOE SURGERY    . OVARIAN CYST REMOVAL    . TONSILLECTOMY    . TUBAL LIGATION     Allergies  Allergen Reactions  . Peanuts [Peanut Oil]     Throat itches and she has stomach pains    Social History   Social History  . Marital  status: Single    Spouse name: n/a  . Number of children: 2  . Years of education: Master's    Occupational History  . [employer]   Social History Main Topics  . Smoking status: Never Smoker  . Smokeless tobacco: Never Used  . Alcohol use Yes     Comment: rarely  . Drug use: No  . Sexual activity: Yes    Partners: Male   Other Topics Concern  . Not on file   Social History Narrative   Marital status: divorced since 2002; +dating seriously x 7 years.      Children:  2 children (28, 20); no grandchildren.      Lives:  with her two children.      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      Tobacco: none       Alcohol: wine once per week.       Exercise:  Walking 3 days per week      Seatbelt: 100%      Guns: none      Sexual activity:  Total sexual partners = 20.  No STDs.  Last STD testing never.   Date males.     Family History  Problem Relation Age of Onset  . Cancer Mother 106    breast cancer  . Cancer Father 38    brain cancer; prostate cancer  . Heart disease Brother 78    heart attack  . Cancer Paternal Grandfather     breast cancer  . Lupus Daughter   . Cancer Sister     breast cancer  . Cancer Sister 64    breast cancer  . Colon cancer Neg Hx        Objective:    BP 130/85 (BP Location: Right Arm, Patient Position: Sitting, Cuff Size: Small)   Pulse 82   Temp 98.6 F (37 C) (Oral)   Resp 16   Ht '5\' 10"'  (1.778 m)   Wt 213 lb (96.6 kg)   SpO2 98%   BMI 30.56 kg/m  Physical Exam  Constitutional: She is oriented to person, place, and time. She appears well-developed and well-nourished. No distress.  HENT:  Head: Normocephalic and atraumatic.  Right Ear: External ear normal.  Left Ear: External ear normal.  Nose: Nose normal.  Mouth/Throat: Oropharynx is clear and moist.  Eyes: Conjunctivae and EOM are normal. Pupils are equal, round, and reactive to light.  Neck: Normal range of  motion. Neck supple. Carotid bruit is not present. No thyromegaly present.  Cardiovascular: Normal rate, regular rhythm, normal heart sounds and intact distal pulses.  Exam reveals no gallop and no friction rub.   No murmur  heard. Pulmonary/Chest: Effort normal and breath sounds normal. She has no wheezes. She has no rales.  Abdominal: Soft. Bowel sounds are normal. She exhibits no distension and no mass. There is no tenderness. There is no rebound and no guarding.  Lymphadenopathy:    She has no cervical adenopathy.  Neurological: She is alert and oriented to person, place, and time. No cranial nerve deficit.  Skin: Skin is warm and dry. No rash noted. She is not diaphoretic. No erythema. No pallor.  Psychiatric: She has a normal mood and affect. Her behavior is normal.   Results for orders placed or performed in visit on 12/03/16  CBC with Differential/Platelet  Result Value Ref Range   WBC 4.4 3.4 - 10.8 x10E3/uL   RBC 4.92 3.77 - 5.28 x10E6/uL   Hemoglobin 13.0 11.1 - 15.9 g/dL   Hematocrit 39.8 34.0 - 46.6 %   MCV 81 79 - 97 fL   MCH 26.4 (L) 26.6 - 33.0 pg   MCHC 32.7 31.5 - 35.7 g/dL   RDW 15.0 12.3 - 15.4 %   Platelets 310 150 - 379 x10E3/uL   Neutrophils 57 Not Estab. %   Lymphs 31 Not Estab. %   Monocytes 9 Not Estab. %   Eos 2 Not Estab. %   Basos 1 Not Estab. %   Neutrophils Absolute 2.6 1.4 - 7.0 x10E3/uL   Lymphocytes Absolute 1.4 0.7 - 3.1 x10E3/uL   Monocytes Absolute 0.4 0.1 - 0.9 x10E3/uL   EOS (ABSOLUTE) 0.1 0.0 - 0.4 x10E3/uL   Basophils Absolute 0.0 0.0 - 0.2 x10E3/uL   Immature Granulocytes 0 Not Estab. %   Immature Grans (Abs) 0.0 0.0 - 0.1 x10E3/uL  Comprehensive metabolic panel  Result Value Ref Range   Glucose 118 (H) 65 - 99 mg/dL   BUN 11 6 - 24 mg/dL   Creatinine, Ser 0.77 0.57 - 1.00 mg/dL   GFR calc non Af Amer 90 >59 mL/min/1.73   GFR calc Af Amer 103 >59 mL/min/1.73   BUN/Creatinine Ratio 14 9 - 23   Sodium 140 134 - 144 mmol/L   Potassium  4.5 3.5 - 5.2 mmol/L   Chloride 100 96 - 106 mmol/L   CO2 23 18 - 29 mmol/L   Calcium 10.1 8.7 - 10.2 mg/dL   Total Protein 8.0 6.0 - 8.5 g/dL   Albumin 5.1 3.5 - 5.5 g/dL   Globulin, Total 2.9 1.5 - 4.5 g/dL   Albumin/Globulin Ratio 1.8 1.2 - 2.2   Bilirubin Total 0.3 0.0 - 1.2 mg/dL   Alkaline Phosphatase 50 39 - 117 IU/L   AST 17 0 - 40 IU/L   ALT 15 0 - 32 IU/L  TSH  Result Value Ref Range   TSH 0.739 0.450 - 4.500 uIU/mL  Hemoglobin A1c  Result Value Ref Range   Hgb A1c MFr Bld 6.1 (H) 4.8 - 5.6 %   Est. average glucose Bld gHb Est-mCnc 128 mg/dL  Lipid panel  Result Value Ref Range   Cholesterol, Total 207 (H) 100 - 199 mg/dL   Triglycerides 254 (H) 0 - 149 mg/dL   HDL 33 (L) >39 mg/dL   VLDL Cholesterol Cal 51 (H) 5 - 40 mg/dL   LDL Calculated 123 (H) 0 - 99 mg/dL   Chol/HDL Ratio 6.3 (H) 0.0 - 4.4 ratio units  VITAMIN D 25 Hydroxy (Vit-D Deficiency, Fractures)  Result Value Ref Range   Vit D, 25-Hydroxy 38.1 30.0 - 100.0 ng/mL  Assessment & Plan:   1. Other fatigue   2. Migraine without aura and without status migrainosus, not intractable   3. Anxiety and depression   4. DUB (dysfunctional uterine bleeding)   5. Family history of breast cancer in mother   46. Hypersomnolence   7. Vitamin D deficiency   8. Screening, lipid    -persistent fatigue; repeat labs today; if normal, recommend sleep study.  Also recommend regular exercise and weight loss which will improve fatigue.   -refill of Prozac provided; doing well emotionally.   Orders Placed This Encounter  Procedures  . CBC with Differential/Platelet  . Comprehensive metabolic panel    Order Specific Question:   Has the patient fasted?    Answer:   Yes  . TSH  . Hemoglobin A1c  . Lipid panel    Order Specific Question:   Has the patient fasted?    Answer:   Yes  . VITAMIN D 25 Hydroxy (Vit-D Deficiency, Fractures)   Meds ordered this encounter  Medications  . DISCONTD: FLUoxetine (PROZAC) 20  MG tablet    Sig: TAKE 2 TABLETS(40 MG) BY MOUTH DAILY    Dispense:  60 tablet    Refill:  5  . DISCONTD: rizatriptan (MAXALT-MLT) 10 MG disintegrating tablet    Sig: Take 1 tablet (10 mg total) by mouth as needed for migraine. May repeat in 2 hours if needed    Dispense:  8 tablet    Refill:  3  . FLUoxetine (PROZAC) 20 MG tablet    Sig: TAKE 2 TABLETS(40 MG) BY MOUTH DAILY    Dispense:  60 tablet    Refill:  5  . rizatriptan (MAXALT-MLT) 10 MG disintegrating tablet    Sig: Take 1 tablet (10 mg total) by mouth as needed for migraine. May repeat in 2 hours if needed    Dispense:  8 tablet    Refill:  3    Return in about 6 months (around 06/03/2017) for recheck anxiety/depression, migraines.   Jessic Standifer Elayne Guerin, M.D. [facility] [address] [place], [place]  [address] [phone] phone [phone] fax

## 2016-12-04 LAB — CBC WITH DIFFERENTIAL/PLATELET
BASOS: 1 %
Basophils Absolute: 0 10*3/uL (ref 0.0–0.2)
EOS (ABSOLUTE): 0.1 10*3/uL (ref 0.0–0.4)
EOS: 2 %
HEMATOCRIT: 39.8 % (ref 34.0–46.6)
Hemoglobin: 13 g/dL (ref 11.1–15.9)
Immature Grans (Abs): 0 10*3/uL (ref 0.0–0.1)
Immature Granulocytes: 0 %
LYMPHS ABS: 1.4 10*3/uL (ref 0.7–3.1)
Lymphs: 31 %
MCH: 26.4 pg — AB (ref 26.6–33.0)
MCHC: 32.7 g/dL (ref 31.5–35.7)
MCV: 81 fL (ref 79–97)
MONOS ABS: 0.4 10*3/uL (ref 0.1–0.9)
Monocytes: 9 %
NEUTROS ABS: 2.6 10*3/uL (ref 1.4–7.0)
Neutrophils: 57 %
Platelets: 310 10*3/uL (ref 150–379)
RBC: 4.92 x10E6/uL (ref 3.77–5.28)
RDW: 15 % (ref 12.3–15.4)
WBC: 4.4 10*3/uL (ref 3.4–10.8)

## 2016-12-04 LAB — LIPID PANEL
Chol/HDL Ratio: 6.3 ratio — ABNORMAL HIGH (ref 0.0–4.4)
Cholesterol, Total: 207 mg/dL — ABNORMAL HIGH (ref 100–199)
HDL: 33 mg/dL — ABNORMAL LOW
LDL Calculated: 123 mg/dL — ABNORMAL HIGH (ref 0–99)
Triglycerides: 254 mg/dL — ABNORMAL HIGH (ref 0–149)
VLDL Cholesterol Cal: 51 mg/dL — ABNORMAL HIGH (ref 5–40)

## 2016-12-04 LAB — COMPREHENSIVE METABOLIC PANEL
A/G RATIO: 1.8 (ref 1.2–2.2)
ALK PHOS: 50 IU/L (ref 39–117)
ALT: 15 IU/L (ref 0–32)
AST: 17 IU/L (ref 0–40)
Albumin: 5.1 g/dL (ref 3.5–5.5)
BUN / CREAT RATIO: 14 (ref 9–23)
BUN: 11 mg/dL (ref 6–24)
Bilirubin Total: 0.3 mg/dL (ref 0.0–1.2)
CO2: 23 mmol/L (ref 18–29)
Calcium: 10.1 mg/dL (ref 8.7–10.2)
Chloride: 100 mmol/L (ref 96–106)
Creatinine, Ser: 0.77 mg/dL (ref 0.57–1.00)
GFR calc Af Amer: 103 mL/min/{1.73_m2} (ref >59)
GFR calc non Af Amer: 90 mL/min/{1.73_m2} (ref >59)
GLOBULIN, TOTAL: 2.9 g/dL (ref 1.5–4.5)
Glucose: 118 mg/dL — ABNORMAL HIGH (ref 65–99)
POTASSIUM: 4.5 mmol/L (ref 3.5–5.2)
SODIUM: 140 mmol/L (ref 134–144)
Total Protein: 8 g/dL (ref 6.0–8.5)

## 2016-12-04 LAB — TSH: TSH: 0.739 u[IU]/mL (ref 0.450–4.500)

## 2016-12-04 LAB — HEMOGLOBIN A1C
ESTIMATED AVERAGE GLUCOSE: 128 mg/dL
Hgb A1c MFr Bld: 6.1 % — ABNORMAL HIGH (ref 4.8–5.6)

## 2016-12-04 LAB — VITAMIN D 25 HYDROXY (VIT D DEFICIENCY, FRACTURES): VIT D 25 HYDROXY: 38.1 ng/mL (ref 30.0–100.0)

## 2017-03-13 ENCOUNTER — Other Ambulatory Visit: Payer: Self-pay | Admitting: Surgery

## 2017-03-14 ENCOUNTER — Encounter: Payer: Self-pay | Admitting: Family Medicine

## 2017-05-23 ENCOUNTER — Ambulatory Visit (INDEPENDENT_AMBULATORY_CARE_PROVIDER_SITE_OTHER): Payer: BC Managed Care – PPO | Admitting: Physician Assistant

## 2017-05-23 ENCOUNTER — Encounter: Payer: Self-pay | Admitting: Physician Assistant

## 2017-05-23 VITALS — BP 124/76 | HR 84 | Temp 98.0°F | Ht 70.0 in | Wt 198.8 lb

## 2017-05-23 DIAGNOSIS — L089 Local infection of the skin and subcutaneous tissue, unspecified: Secondary | ICD-10-CM | POA: Diagnosis not present

## 2017-05-23 DIAGNOSIS — L723 Sebaceous cyst: Secondary | ICD-10-CM

## 2017-05-23 DIAGNOSIS — N611 Abscess of the breast and nipple: Secondary | ICD-10-CM | POA: Diagnosis not present

## 2017-05-23 MED ORDER — TRAMADOL HCL 50 MG PO TABS: 50.0000 mg | ORAL_TABLET | Freq: Three times a day (TID) | ORAL | 0 refills | Status: DC | PRN

## 2017-05-23 MED ORDER — CEPHALEXIN 500 MG PO CAPS: 500.0000 mg | ORAL_CAPSULE | Freq: Two times a day (BID) | ORAL | 0 refills | Status: AC

## 2017-05-23 MED ORDER — FLUCONAZOLE 150 MG PO TABS: 150.0000 mg | ORAL_TABLET | Freq: Once | ORAL | 0 refills | Status: AC

## 2017-05-23 NOTE — Patient Instructions (Addendum)
Please cover with dressing twice per day.   Take antibiotic as prescribed.  You can not get the wound wet.    Incision and Drainage, Care After Refer to this sheet in the next few weeks. These instructions provide you with information about caring for yourself after your procedure. Your health care provider may also give you more specific instructions. Your treatment has been planned according to current medical practices, but problems sometimes occur. Call your health care provider if you have any problems or questions after your procedure. What can I expect after the procedure? After the procedure, it is common to have:  Pain or discomfort around your incision site.  Drainage from your incision.  Follow these instructions at home:  Take over-the-counter and prescription medicines only as told by your health care provider.  If you were prescribed an antibiotic medicine, take it as told by your health care provider.Do not stop taking the antibiotic even if you start to feel better.  Followinstructions from your health care provider about: ? How to take care of your incision. ? When and how you should change your packing and bandage (dressing). Wash your hands with soap and water before you change your dressing. If soap and water are not available, use hand sanitizer. ? When you should remove your dressing.  Do not take baths, swim, or use a hot tub until your health care provider approves.  Keep all follow-up visits as told by your health care provider. This is important.  Check your incision area every day for signs of infection. Check for: ? More redness, swelling, or pain. ? More fluid or blood. ? Warmth. ? Pus or a bad smell. Contact a health care provider if:  Your cyst or abscess returns.  You have a fever.  You have more redness, swelling, or pain around your incision.  You have more fluid or blood coming from your incision.  Your incision feels warm to the  touch.  You have pus or a bad smell coming from your incision. Get help right away if:  You have severe pain or bleeding.  You cannot eat or drink without vomiting.  You have decreased urine output.  You become short of breath.  You have chest pain.  You cough up blood.  The area where the incision and drainage occurred becomes numb or it tingles. This information is not intended to replace advice given to you by your health care provider. Make sure you discuss any questions you have with your health care provider. Document Released: 02/17/2012 Document Revised: 04/26/2016 Document Reviewed: 09/15/2015 Elsevier Interactive Patient Education  2017 [employer]     IF you received an x-ray today, you will receive an [facility]. Please contact [facility] at [phone] with questions or concerns regarding your invoice.   IF you received labwork today, you will receive an invoice from [facility]. Please contact [facility] at [phone] with questions or concerns regarding your invoice.   Our billing staff will not be able to assist you with questions regarding bills from these companies.  You will be contacted with the lab results as soon as they are available. The fastest way to get your results is to activate your My Chart account. Instructions are located on the last page of this paperwork. If you have not heard from us regarding the results in 2 weeks, please contact this office.

## 2017-05-26 ENCOUNTER — Encounter: Payer: Self-pay | Admitting: Physician Assistant

## 2017-05-26 ENCOUNTER — Ambulatory Visit (INDEPENDENT_AMBULATORY_CARE_PROVIDER_SITE_OTHER): Payer: BC Managed Care – PPO | Admitting: Physician Assistant

## 2017-05-26 VITALS — BP 144/91 | HR 71 | Temp 98.0°F | Resp 18 | Ht 69.45 in | Wt 203.6 lb

## 2017-05-26 DIAGNOSIS — L723 Sebaceous cyst: Secondary | ICD-10-CM

## 2017-05-26 DIAGNOSIS — L089 Local infection of the skin and subcutaneous tissue, unspecified: Secondary | ICD-10-CM

## 2017-05-26 LAB — WOUND CULTURE

## 2017-05-26 NOTE — Patient Instructions (Signed)
Continue taking antibiotics as prescribed. I recommend using a warm compress to the affected area 4-5 x a day for 20 minutes at a time. You may use ibuprofen 600mg  every 8 hours for the swelling. Keep wound covered while showering, change the dressing when it becomes soaked. Follow up in 2 days for reevaluation. Thank you for letting me participate in your health and well being.

## 2017-05-26 NOTE — Progress Notes (Signed)
    MRN: [id number] DOB: [birth year]  Subjective:   Kendra Saunders is a 51 y.o. female presenting for follow up on wound care. Pt initially seen on 05/23/17 for infected sebaceous cyst of left lower breast. I&D performed. Given Rx for keflex. Today, pt notes she has has been doing well. Taking the antibiotics as prescribed. She has kept the wound covered as instructed. Has noticed some purulent drainage from the wound cavity. Denies pain, fever, chills, redness, and warmth.   Kendra Saunders has a current medication list which includes the following prescription(s): cephalexin, fluoxetine, hydrocodone-acetaminophen, rizatriptan, and tramadol, and the following Facility-Administered Medications: sodium chloride. Also is allergic to peanuts [peanut oil].  Kendra Saunders  has a past medical history of Anxiety; Asthma; Depression; Migraine; Seasonal allergies; and Sickle cell trait (HCC). Also  has a past surgical history that includes Eye surgery; Tubal ligation; Ovarian cyst removal; Tonsillectomy; Hammer toe surgery; and orbital right fracture (1995).   Objective:   Vitals: BP (!) 144/91 (BP Location: Right Arm, Patient Position: Sitting, Cuff Size: Large)   Pulse 71   Temp 98 F (36.7 C) (Oral)   Resp 18   Ht 5' 9.45" (1.764 m)   Wt 203 lb 9.6 oz (92.4 kg)   LMP 05/08/2017   SpO2 100%   BMI 29.68 kg/m   Physical Exam  Constitutional: She is oriented to person, place, and time. She appears well-developed and well-nourished.  HENT:  Head: Normocephalic and atraumatic.  Eyes: Conjunctivae are normal.  Neck: Normal range of motion.  Pulmonary/Chest: Effort normal.    Neurological: She is alert and oriented to person, place, and time.  Skin: Skin is warm and dry.  Psychiatric: She has a normal mood and affect.  Vitals reviewed.  Packing removed. Wound cavity cleansed with 5 cc 2% lidocaine without epinephrine. No purulent drainage expressed. Wound cavity depth ~1cm. Wound lightly repacked with 0.25"  packing. Dressing applied.   No results found for this or any previous visit (from the past 24 hour(s)).  Assessment and Plan :  1. Infected sebaceous cyst Well healing wound. Encouraged to apply warm compress to affected area 4-5 x a day for 20 min at a time. Take ibuprofen as prescribed for inflammation. Continue antibiotics as prescribed. Follow up in 2 days for reevaluation.   Benjiman CoreBrittany Elizabethanne Lusher, PA-C  [facility] 05/26/2017 10:48 AM

## 2017-05-26 NOTE — Progress Notes (Signed)
PRIMARY CARE AT [facility] [address], [place] [address] [phone]  Date:  05/23/2017   Name:  Kendra Saunders   DOB:  1966-07-18   MRN:  [id number]  PCP:  Ethelda Chick, MD    History of Present Illness:  Kendra Saunders is a 50 y.o. female patient who presents to PCP with  Chief Complaint  Patient presents with  . Cyst    left breast, onset: 05/22/17, imflammation and painful     Patient reports that she has painful swelling at her lower left breast that is worsening.  Patient has done nothing for the swelling.  No fever, or drainage.  She has noted to have an abscess at the same location about 2 year ago.  She was seen by a breast surgeon who would remove it if it became an issue.   Allergies  Allergen Reactions  . Peanuts [Peanut Oil]     Throat itches and she has stomach pains    Medication list has been reviewed and updated.  Current Outpatient Prescriptions on File Prior to Visit  Medication Sig Dispense Refill  . FLUoxetine (PROZAC) 20 MG tablet TAKE 2 TABLETS(40 MG) BY MOUTH DAILY 60 tablet 5  . rizatriptan (MAXALT-MLT) 10 MG disintegrating tablet Take 1 tablet (10 mg total) by mouth as needed for migraine. May repeat in 2 hours if needed 8 tablet 3  . HYDROcodone-acetaminophen (NORCO/VICODIN) 5-325 MG tablet Take 1 tablet by mouth every 4 (four) hours as needed for moderate pain.     Current Facility-Administered Medications on File Prior to Visit  Medication Dose Route Frequency Provider Last Rate Last Dose  . 0.9 %  sodium chloride infusion  500 mL Intravenous Continuous Nandigam, Kavitha V, MD        ROS ROS otherwise unremarkable unless listed above.  Physical Examination: BP 124/76 (BP Location: Left Arm, Patient Position: Sitting, Cuff Size: Large)   Pulse 84   Temp 98 F (36.7 C) (Oral)   Ht 5\' 10"  (1.778 m)   Wt 198 lb 12.8 oz (90.2 kg)   LMP 05/08/2017   SpO2 99%   BMI 28.52 kg/m  Ideal Body Weight: Weight in (lb) to have BMI = 25:  173.9  Physical Exam  Constitutional: She is oriented to person, place, and time. She appears well-developed and well-nourished. No distress.  HENT:  Head: Normocephalic and atraumatic.  Right Ear: External ear normal.  Left Ear: External ear normal.  Eyes: Conjunctivae and EOM are normal. Pupils are equal, round, and reactive to light.  Cardiovascular: Normal rate.   Pulmonary/Chest: Effort normal. No respiratory distress.  Lower left breast 3cm from the aerola at the 7 oclock location with swelling with punctate.  Tender to the touch.  Induration surrounding.    Neurological: She is alert and oriented to person, place, and time.  Skin: She is not diaphoretic.  Psychiatric: She has a normal mood and affect. Her behavior is normal.   Procedure: verbal consent obtained. Cleansed with alcohol swab.  1% lidocaine placed at the swollen site at the left lower breast.  povidine swabbed.  11 blade utilized to place a 1cm incision along the punctate area.  Generous purulent fluid expressed.  Sebaceous material removed, and apparent sac materials were also extracted.  Cleansed with normal saline.  Quarter packing aggressively placed.  Dressing applied.   Assessment and Plan: Kendra Saunders is a 51 y.o. female who is here today for breast swelling.   Keflex given, and wound culture.  Anti-fungal given after report of yeast infection induced by abx.   rtc in 3 days.   Infected sebaceous cyst - Plan: cephALEXin (KEFLEX) 500 MG capsule, WOUND CULTURE  Breast abscess  Kendra PlattStephanie English, PA-C [facility] 6/18/20187:15 AM

## 2017-05-27 ENCOUNTER — Telehealth: Payer: Self-pay | Admitting: Family Medicine

## 2017-05-27 NOTE — Telephone Encounter (Signed)
LMOM TO CALL AND RESCHEDULE FROM 06-10-17 TO recheck depression THE DOCTOR WILL NOT BE IN THE OFFICE THAT DAY

## 2017-05-28 ENCOUNTER — Encounter: Payer: Self-pay | Admitting: Physician Assistant

## 2017-05-28 ENCOUNTER — Ambulatory Visit (INDEPENDENT_AMBULATORY_CARE_PROVIDER_SITE_OTHER): Payer: BC Managed Care – PPO | Admitting: Physician Assistant

## 2017-05-28 VITALS — BP 128/88 | HR 79 | Temp 98.4°F | Resp 16 | Ht 69.45 in | Wt 201.2 lb

## 2017-05-28 DIAGNOSIS — L723 Sebaceous cyst: Secondary | ICD-10-CM

## 2017-05-28 DIAGNOSIS — L089 Local infection of the skin and subcutaneous tissue, unspecified: Secondary | ICD-10-CM

## 2017-05-28 NOTE — Progress Notes (Signed)
    MRN: [id number] DOB: 04/18/1966  Subjective:   Kendra GurneyDecolia Saunders is a 51 y.o. female presenting for follow up on wound care. Pt initially seen on 05/23/17 for infected sebaceous cyst of left lower breast. I&D performed. Given Rx for keflex. Returned on 05/26/17 for repacking. Wound culture has returned and showed moderated growth of mixed skin flora, instructed by PA English to continue antibiotic.  Today, pt notes she is doing well. Taking the antibiotics as prescribed, has two days left. Has applied warm compresses to the affected area, which has allowed for more drainage to occur. She has kept the wound covered as instructed.  Denies pain, fever, chills, redness, and warmth.    Kendra Saunders has a current medication list which includes the following prescription(s): fluoxetine, rizatriptan, tramadol, cephalexin, and hydrocodone-acetaminophen, and the following Facility-Administered Medications: sodium chloride. Also is allergic to peanuts [peanut oil].  Kendra Saunders  has a past medical history of Anxiety; Asthma; Depression; Migraine; Seasonal allergies; and Sickle cell trait (HCC). Also  has a past surgical history that includes Eye surgery; Tubal ligation; Ovarian cyst removal; Tonsillectomy; Hammer toe surgery; and orbital right fracture (1995).   Objective:   Vitals: BP 128/88 (BP Location: Left Arm, Patient Position: Sitting, Cuff Size: Large)   Pulse 79   Temp 98.4 F (36.9 C) (Oral)   Resp 16   Ht 5' 9.45" (1.764 m)   Wt 201 lb 3.2 oz (91.3 kg)   LMP 05/08/2017   SpO2 98%   BMI 29.33 kg/m   Physical Exam  Constitutional: She is oriented to person, place, and time. She appears well-developed and well-nourished.  HENT:  Head: Normocephalic and atraumatic.  Eyes: Conjunctivae are normal.  Neck: Normal range of motion.  Pulmonary/Chest: Effort normal.    Neurological: She is alert and oriented to person, place, and time.  Skin: Skin is warm and dry.  Psychiatric: She has a normal mood  and affect.  Vitals reviewed.  Packing removed. Wound cavity cleansed with 5 cc 1% lidocaine without epinephrine. No purulent drainage expressed. Wound cavity depth 0.75cm. Wound lightly repacked with 0.25" packing. Dressing applied.   No results found for this or any previous visit (from the past 24 hour(s)).  Assessment and Plan :  1. Infected sebaceous cyst Well healing wound. Encouraged to continue applying warm compress to affected area 4-5 x a day for 20 min at a time. Take ibuprofen as prescribed for inflammation. Continue antibiotics as prescribed. Follow up in 2 days for reevaluation by PA English.   Benjiman CoreBrittany Oryon Gary, PA-C  Primary Care at [facility] 05/28/2017 8:46 AM

## 2017-05-28 NOTE — Patient Instructions (Addendum)
   Continue taking antibiotics as prescribed. I recommend still using a warm compress to the affected area 4-5 x a day for 20 minutes at a time. To also help with swelling, you may use ibuprofen 600mg  every 8 hours for the swelling. Keep wound covered while showering, change the dressing when it becomes soaked. Follow up in 2 days for reevaluation by PA English. Thank you for letting me participate in your health and well being.  IF you received an x-ray today, you will receive an invoice from [facility]. Please contact [facility] at [phone] with questions or concerns regarding your invoice.   IF you received labwork today, you will receive an invoice from [facility]. Please contact [facility] at [phone] with questions or concerns regarding your invoice.   Our billing staff will not be able to assist you with questions regarding bills from these companies.  You will be contacted with the lab results as soon as they are available. The fastest way to get your results is to activate your My Chart account. Instructions are located on the last page of this paperwork. If you have not heard from us regarding the results in 2 weeks, please contact this office.

## 2017-05-30 ENCOUNTER — Encounter: Payer: Self-pay | Admitting: Physician Assistant

## 2017-05-30 ENCOUNTER — Ambulatory Visit (INDEPENDENT_AMBULATORY_CARE_PROVIDER_SITE_OTHER): Payer: BC Managed Care – PPO | Admitting: Physician Assistant

## 2017-05-30 VITALS — BP 124/82 | HR 85 | Temp 98.4°F | Resp 18 | Ht 69.5 in | Wt 200.4 lb

## 2017-05-30 DIAGNOSIS — L723 Sebaceous cyst: Secondary | ICD-10-CM

## 2017-05-30 DIAGNOSIS — L089 Local infection of the skin and subcutaneous tissue, unspecified: Secondary | ICD-10-CM

## 2017-05-30 DIAGNOSIS — Z5189 Encounter for other specified aftercare: Secondary | ICD-10-CM

## 2017-05-30 NOTE — Progress Notes (Signed)
PRIMARY CARE AT [facility] [address] [place] [address] [phone]  Date:  05/30/2017   Name:  Diego Delancey   DOB:  Jan 31, 1966   MRN:  [id number]  PCP:  Ethelda Chick, MD    History of Present Illness:  Jayliani Wanner is a 51 y.o. female patient who presents to PCP with  Chief Complaint  Patient presents with  . Wound Care    05/23/2017, left breast infected cyst  . Follow-up     Patient is here for wound care status post I and d of infected sebaceous cyst.  Patient is here for follow up of her wound care.  She states that she is doing fine.  It is mildly pruritic.  No heavy drainage, though she states that she does not look at the wound much.  No fevers.    Patient Active Problem List   Diagnosis Date Noted  . Anxiety and depression 01/09/2016  . DUB (dysfunctional uterine bleeding) 01/09/2016  . Obesity 01/09/2016  . Hypersomnolence 01/09/2016  . Family history of breast cancer in mother 01/09/2016  . Overweight (BMI 25.0-29.9) 10/23/2014  . 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 SICKLE CELL TRAIT 06/24/2007  . Migraine without aura 06/24/2007    Past Medical History:  Diagnosis Date  . Anxiety   . Asthma    childhood; last Albuterol use elementary school.  . Depression   . Migraine   . Seasonal allergies   . Sickle cell trait [facility])     Past Surgical History:  Procedure Laterality Date  . EYE SURGERY     R orbital fracture  . HAMMER TOE SURGERY    . orbital right fracture  1995  . OVARIAN CYST REMOVAL    . TONSILLECTOMY    . TUBAL LIGATION      Social History  Substance Use Topics  . Smoking status: Never Smoker  . Smokeless tobacco: Never Used  . Alcohol use Yes     Comment: rarely    Family History  Problem Relation Age of Onset  . Cancer Mother 39       breast cancer  . Cancer Father 11       brain cancer; prostate cancer  . Heart disease Brother 39       heart attack  . Cancer Paternal Grandfather        breast cancer  . Lupus Daughter     . Cancer Sister        breast cancer  . Cancer Sister 4       breast cancer  . Colon cancer Neg Hx     Allergies  Allergen Reactions  . Peanuts [Peanut Oil]     Throat itches and she has stomach pains    Medication list has been reviewed and updated.  Current Outpatient Prescriptions on File Prior to Visit  Medication Sig Dispense Refill  . FLUoxetine (PROZAC) 20 MG tablet TAKE 2 TABLETS(40 MG) BY MOUTH DAILY 60 tablet 5  . rizatriptan (MAXALT-MLT) 10 MG disintegrating tablet Take 1 tablet (10 mg total) by mouth as needed for migraine. May repeat in 2 hours if needed 8 tablet 3  . cephALEXin (KEFLEX) 500 MG capsule Take 1 capsule (500 mg total) by mouth 2 (two) times daily. (Patient not taking: Reported on 05/28/2017) 14 capsule 0  . HYDROcodone-acetaminophen (NORCO/VICODIN) 5-325 MG tablet Take 1 tablet by mouth every 4 (four) hours as needed for moderate pain.    . traMADol (ULTRAM) 50 MG tablet Take 1  tablet (50 mg total) by mouth every 8 (eight) hours as needed. (Patient not taking: Reported on 05/30/2017) 12 tablet 0   Current Facility-Administered Medications on File Prior to Visit  Medication Dose Route Frequency Provider Last Rate Last Dose  . 0.9 %  sodium chloride infusion  500 mL Intravenous Continuous Nandigam, Kavitha V, MD        ROS ROS otherwise unremarkable unless listed above.  Physical Examination: BP 124/82 (BP Location: Right Arm, Patient Position: Sitting, Cuff Size: Normal)   Pulse 85   Temp 98.4 F (36.9 C) (Oral)   Resp 18   Ht 5' 9.5" (1.765 m)   Wt 200 lb 6.4 oz (90.9 kg)   LMP 05/08/2017   SpO2 97%   BMI 29.17 kg/m  Ideal Body Weight: Weight in (lb) to have BMI = 25: 171.4  Physical Exam  Constitutional: She is oriented to person, place, and time. She appears well-developed and well-nourished. No distress.  HENT:  Head: Normocephalic and atraumatic.  Right Ear: External ear normal.  Left Ear: External ear normal.  Eyes: Conjunctivae and  EOM are normal. Pupils are equal, round, and reactive to light.  Cardiovascular: Normal rate.   Pulmonary/Chest: Effort normal. No respiratory distress.    Neurological: She is alert and oriented to person, place, and time.  Skin: She is not diaphoretic.  Psychiatric: She has a normal mood and affect. Her behavior is normal.     Assessment and Plan: Raymond GurneyDecolia Tandon is a 51 y.o. female who is here today for wound care. Healing. Packed minimally.  Remove after 2 days.  Wound care discussed. rtc as needed.    Infected sebaceous cyst  Encounter for wound care  Trena PlattStephanie Taysia Rivere, PA-C [facility] 6/24/201810:04 AM

## 2017-05-30 NOTE — Patient Instructions (Addendum)
  Please keep the packing in and change top dressing over the next 2 days.  After that, remove the packing inside and wash the wound with soap and water.  Stray away from deodorant and fragranted soaps.  It is ok to use an antibacterial, or gold Dial.  Keep the area covered until the wound scabs over. Return if there is added redness, swelling, fever, nausea, and pain.  However this should heal well.    IF you received an x-ray today, you will receive an invoice from [facility]. Please contact [facility] at [phone] with questions or concerns regarding your invoice.   IF you received labwork today, you will receive an invoice from [facility]. Please contact [facility] at [phone] with questions or concerns regarding your invoice.   Our billing staff will not be able to assist you with questions regarding bills from these companies.  You will be contacted with the lab results as soon as they are available. The fastest way to get your results is to activate your My Chart account. Instructions are located on the last page of this paperwork. If you have not heard from us regarding the results in 2 weeks, please contact this office.

## 2017-06-10 ENCOUNTER — Ambulatory Visit: Payer: BC Managed Care – PPO | Admitting: Family Medicine

## 2017-06-25 ENCOUNTER — Encounter: Payer: Self-pay | Admitting: Family Medicine

## 2017-06-25 ENCOUNTER — Ambulatory Visit (INDEPENDENT_AMBULATORY_CARE_PROVIDER_SITE_OTHER): Payer: BC Managed Care – PPO | Admitting: Family Medicine

## 2017-06-25 VITALS — BP 124/85 | HR 80 | Temp 98.0°F | Resp 15 | Ht 70.08 in | Wt 202.0 lb

## 2017-06-25 DIAGNOSIS — F329 Major depressive disorder, single episode, unspecified: Secondary | ICD-10-CM

## 2017-06-25 DIAGNOSIS — E7800 Pure hypercholesterolemia, unspecified: Secondary | ICD-10-CM

## 2017-06-25 DIAGNOSIS — R7302 Impaired glucose tolerance (oral): Secondary | ICD-10-CM

## 2017-06-25 DIAGNOSIS — F419 Anxiety disorder, unspecified: Secondary | ICD-10-CM | POA: Diagnosis not present

## 2017-06-25 DIAGNOSIS — G43009 Migraine without aura, not intractable, without status migrainosus: Secondary | ICD-10-CM | POA: Diagnosis not present

## 2017-06-25 MED ORDER — FLUOXETINE HCL 20 MG PO TABS: ORAL_TABLET | ORAL | 5 refills | Status: DC

## 2017-06-25 MED ORDER — RIZATRIPTAN BENZOATE 10 MG PO TBDP: 10.0000 mg | ORAL_TABLET | ORAL | 3 refills | Status: DC | PRN

## 2017-06-25 NOTE — Patient Instructions (Addendum)
   IF you received an x-ray today, you will receive an invoice from [facility]. Please contact [facility] at [phone] with questions or concerns regarding your invoice.   IF you received labwork today, you will receive an invoice from [facility]. Please contact [facility] at [phone] with questions or concerns regarding your invoice.   Our billing staff will not be able to assist you with questions regarding bills from these companies.  You will be contacted with the lab results as soon as they are available. The fastest way to get your results is to activate your My Chart account. Instructions are located on the last page of this paperwork. If you have not heard from us regarding the results in 2 weeks, please contact this office.      Carbohydrate Counting for Diabetes Mellitus, Adult Carbohydrate counting is a method for keeping track of how many carbohydrates you eat. Eating carbohydrates naturally increases the amount of sugar (glucose) in the blood. Counting how many carbohydrates you eat helps keep your blood glucose within normal limits, which helps you manage your diabetes (diabetes mellitus). It is important to know how many carbohydrates you can safely have in each meal. This is different for every person. A diet and nutrition specialist (registered dietitian) can help you make a meal plan and calculate how many carbohydrates you should have at each meal and snack. Carbohydrates are found in the following foods:  Grains, such as breads and cereals.  Dried beans and soy products.  Starchy vegetables, such as potatoes, peas, and corn.  Fruit and fruit juices.  Milk and yogurt.  Sweets and snack foods, such as cake, cookies, candy, chips, and soft drinks.  How do I count carbohydrates? There are two ways to count carbohydrates in food. You can use either of the methods or a combination of both. Reading "Nutrition Facts" on packaged food The  "Nutrition Facts" list is included on the labels of almost all packaged foods and beverages in the [place] It includes:  The serving size.  Information about nutrients in each serving, including the grams (g) of carbohydrate per serving.  To use the "Nutrition Facts":  Decide how many servings you will have.  Multiply the number of servings by the number of carbohydrates per serving.  The resulting number is the total amount of carbohydrates that you will be having.  Learning standard serving sizes of other foods When you eat foods containing carbohydrates that are not packaged or do not include "Nutrition Facts" on the label, you need to measure the servings in order to count the amount of carbohydrates:  Measure the foods that you will eat with a food scale or measuring cup, if needed.  Decide how many standard-size servings you will eat.  Multiply the number of servings by 15. Most carbohydrate-rich foods have about 15 g of carbohydrates per serving. ? For example, if you eat 8 oz (170 g) of strawberries, you will have eaten 2 servings and 30 g of carbohydrates (2 servings x 15 g = 30 g).  For foods that have more than one food mixed, such as soups and casseroles, you must count the carbohydrates in each food that is included.  The following list contains standard serving sizes of common carbohydrate-rich foods. Each of these servings has about 15 g of carbohydrates:   hamburger bun or  English muffin.   oz (15 mL) syrup.   oz (14 g) jelly.  1 slice of bread.  1 six-inch tortilla.    3 oz (85 g) cooked rice or pasta.  4 oz (113 g) cooked dried beans.  4 oz (113 g) starchy vegetable, such as peas, corn, or potatoes.  4 oz (113 g) hot cereal.  4 oz (113 g) mashed potatoes or  of a large baked potato.  4 oz (113 g) canned or frozen fruit.  4 oz (120 mL) fruit juice.  4-6 crackers.  6 chicken nuggets.  6 oz (170 g) unsweetened dry cereal.  6 oz (170 g) plain  fat-free yogurt or yogurt sweetened with artificial sweeteners.  8 oz (240 mL) milk.  8 oz (170 g) fresh fruit or one small piece of fruit.  24 oz (680 g) popped popcorn.  Example of carbohydrate counting Sample meal  3 oz (85 g) chicken breast.  6 oz (170 g) brown rice.  4 oz (113 g) corn.  8 oz (240 mL) milk.  8 oz (170 g) strawberries with sugar-free whipped topping. Carbohydrate calculation 1. Identify the foods that contain carbohydrates: ? Rice. ? Corn. ? Milk. ? Strawberries. 2. Calculate how many servings you have of each food: ? 2 servings rice. ? 1 serving corn. ? 1 serving milk. ? 1 serving strawberries. 3. Multiply each number of servings by 15 g: ? 2 servings rice x 15 g = 30 g. ? 1 serving corn x 15 g = 15 g. ? 1 serving milk x 15 g = 15 g. ? 1 serving strawberries x 15 g = 15 g. 4. Add together all of the amounts to find the total grams of carbohydrates eaten: ? 30 g + 15 g + 15 g + 15 g = 75 g of carbohydrates total. This information is not intended to replace advice given to you by your health care provider. Make sure you discuss any questions you have with your health care provider. Document Released: 11/25/2005 Document Revised: 06/14/2016 Document Reviewed: 05/08/2016 Elsevier Interactive Patient Education  2018 [employer]  

## 2017-06-25 NOTE — Progress Notes (Signed)
Subjective:    Patient ID: Kendra Saunders, female    DOB: 07-May-1966, 51 y.o.   MRN: [id number]  06/25/2017  Depression (6 month follow-up)   HPI This 51 y.o. female presents for evaluation of depression/anxiety.   Fatigue is improved from January 2018.  Working 27 hours per week at [employer]; not exercising at [employer].  Labs normal other htan 6.1 HgbA1c.  Week 8 of no meat.  Beans and meatless products for protein.  [place] has moved out yet oldest has moved back in to save money.  Emotionally doing well.  Had to put dog asleep in April 2018 at age 43 years old.   HyperCholesterolemia: repeat today.  Glucose intolerance: sugar/glucose 118; HgbA1c 6.1.    Migraines twice per month; taking Maxalt works really well.  Not missing work.  Too much sodium is trigger.   Depression screen [facility] 2/9 06/25/2017 05/30/2017 05/28/2017 05/26/2017 05/23/2017  Decreased Interest 1 0 0 0 0  Down, Depressed, Hopeless 1 0 0 0 0  PHQ - 2 Score 2 0 0 0 0  Altered sleeping 0 - - - -  Tired, decreased energy 0 - - - -  Change in appetite 0 - - - -  Feeling bad or failure about yourself  0 - - - -  Trouble concentrating 0 - - - -  Moving slowly or fidgety/restless 0 - - - -  Suicidal thoughts 0 - - - -  PHQ-9 Score 2 - - - -  Difficult doing work/chores - - - - -    BP Readings from Last 3 Encounters:  06/25/17 124/85  05/30/17 124/82  05/28/17 128/88   Wt Readings from Last 3 Encounters:  06/25/17 202 lb (91.6 kg)  05/30/17 200 lb 6.4 oz (90.9 kg)  05/28/17 201 lb 3.2 oz (91.3 kg)   Immunization History  Administered Date(s) Administered  . Influenza Split 10/10/2015, 10/19/2016  . Influenza,inj,Quad PF,36+ Mos 09/18/2013, 10/19/2014  . Tdap 12/25/2015     Review of Systems  Constitutional: Negative for chills, diaphoresis, fatigue and fever.  Eyes: Negative for visual disturbance.  Respiratory: Negative for cough and shortness of breath.   Cardiovascular: Negative for chest pain,  palpitations and leg swelling.  Gastrointestinal: Negative for abdominal pain, constipation, diarrhea, nausea and vomiting.  Endocrine: Negative for cold intolerance, heat intolerance, polydipsia, polyphagia and polyuria.  Neurological: Negative for dizziness, tremors, seizures, syncope, facial asymmetry, speech difficulty, weakness, light-headedness, numbness and headaches.    Past Medical History:  Diagnosis Date  . Anxiety   . Asthma    childhood; last Albuterol use elementary school.  . Depression   . Migraine   . Seasonal allergies   . Sickle cell trait [facility])    Past Surgical History:  Procedure Laterality Date  . EYE SURGERY     R orbital fracture  . HAMMER TOE SURGERY    . orbital right fracture  1995  . OVARIAN CYST REMOVAL    . TONSILLECTOMY    . TUBAL LIGATION     Allergies  Allergen Reactions  . Peanuts [Peanut Oil]     Throat itches and she has stomach pains   Current Outpatient Prescriptions  Medication Sig Dispense Refill  . FLUoxetine (PROZAC) 20 MG tablet TAKE 2 TABLETS(40 MG) BY MOUTH DAILY 60 tablet 5  . HYDROcodone-acetaminophen (NORCO/VICODIN) 5-325 MG tablet Take 1 tablet by mouth every 4 (four) hours as needed for moderate pain.    . rizatriptan (MAXALT-MLT) 10 MG disintegrating  tablet Take 1 tablet (10 mg total) by mouth as needed for migraine. May repeat in 2 hours if needed 8 tablet 3  . traMADol (ULTRAM) 50 MG tablet Take 1 tablet (50 mg total) by mouth every 8 (eight) hours as needed. 12 tablet 0   Current Facility-Administered Medications  Medication Dose Route Frequency Provider Last Rate Last Dose  . 0.9 %  sodium chloride infusion  500 mL Intravenous Continuous Nandigam, Eleonore ChiquitoKavitha V, MD       Social History   Social History  . Marital status: Single    Spouse name: n/a  . Number of children: 2  . Years of education: Master's    Occupational History  . TEACHER [employer]   Social History Main Topics  . Smoking status:  Never Smoker  . Smokeless tobacco: Never Used  . Alcohol use Yes     Comment: rarely  . Drug use: No  . Sexual activity: Yes    Partners: Male   Other Topics Concern  . Not on file   Social History Narrative   Marital status: divorced since 2002; +dating seriously x 7 years.      Children:  2 children (28, 20); no grandchildren.      Lives:  with her two children.      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      Tobacco: none       Alcohol: wine once per week.       Exercise:  Walking 3 days per week      Seatbelt: 100%      Guns: none      Sexual activity:  Total sexual partners = 20.  No STDs.  Last STD testing never.   Date males.     Family History  Problem Relation Age of Onset  . Cancer Mother 1339       breast cancer  . Cancer Father 3474       brain cancer; prostate cancer  . Heart disease Brother 39       heart attack  . Cancer Paternal Grandfather        breast cancer  . Lupus Daughter   . Cancer Sister        breast cancer  . Cancer Sister [id number]       breast cancer  . Colon cancer Neg Hx        Objective:    BP 124/85   Pulse 80   Temp 98 F (36.7 C) (Oral)   Resp 15   Ht 5' 10.08" (1.78 m)   Wt 202 lb (91.6 kg)   SpO2 98%   BMI 28.92 kg/m  Physical Exam  Constitutional: She is oriented to person, place, and time. She appears well-developed and well-nourished. No distress.  HENT:  Head: Normocephalic and atraumatic.  Right Ear: External ear normal.  Left Ear: External ear normal.  Nose: Nose normal.  Mouth/Throat: Oropharynx is clear and moist.  Eyes: Pupils are equal, round, and reactive to light. Conjunctivae and EOM are normal.  Neck: Normal range of motion. Neck supple. Carotid bruit is not present. No thyromegaly present.  Cardiovascular: Normal rate, regular rhythm, normal heart sounds and intact distal pulses.  Exam reveals no gallop and no friction rub.   No murmur heard. Pulmonary/Chest: Effort  normal and breath sounds normal. She has no wheezes. She has no rales.  Abdominal: Soft. Bowel sounds are normal. She exhibits no distension  and no mass. There is no tenderness. There is no rebound and no guarding.  Lymphadenopathy:    She has no cervical adenopathy.  Neurological: She is alert and oriented to person, place, and time. No cranial nerve deficit.  Skin: Skin is warm and dry. No rash noted. She is not diaphoretic. No erythema. No pallor.  Psychiatric: She has a normal mood and affect. Her behavior is normal.        Assessment & Plan:   1. Anxiety and depression   2. Migraine without aura and without status migrainosus, not intractable   3. Glucose intolerance (impaired glucose tolerance)   4. Pure hypercholesterolemia    -controlled anxiety/depression and migraines.   -new onset glucose intolerance and hypercholesterolemia.   I recommend weight loss, exercise, and low-carbohydrate low-sugar food choices. You should AVOID: regular sodas, sweetened tea, fruit juices.  You should LIMIT: breads, pastas, rice, potatoes, and desserts/sweets.  I would recommend limiting your total carbohydrate intake per meal to 45 grams; I would limit your total carbohydrate intake per snack to 30 grams.  I would also have a goal of 60 grams of protein intake per day; this would equal 10-15 grams of protein per meal and 5-10 grams of protein per snack.  Orders Placed This Encounter  Procedures  . CBC with Differential/Platelet  . Comprehensive metabolic panel    Order Specific Question:   Has the patient fasted?    Answer:   Yes  . Hemoglobin A1c  . Lipid panel    Order Specific Question:   Has the patient fasted?    Answer:   Yes   Meds ordered this encounter  Medications  . FLUoxetine (PROZAC) 20 MG tablet    Sig: TAKE 2 TABLETS(40 MG) BY MOUTH DAILY    Dispense:  60 tablet    Refill:  5  . rizatriptan (MAXALT-MLT) 10 MG disintegrating tablet    Sig: Take 1 tablet (10 mg total) by  mouth as needed for migraine. May repeat in 2 hours if needed    Dispense:  8 tablet    Refill:  3    Return in about 6 months (around 12/26/2017) for recheck depression/anxiety, migraines, prediabetes, high cholesterol.   Onedia Vargus Paulita Fujita, M.D. Primary Care at [facility] previously [facility] [address] [place], [place]  [address] [phone] phone [phone] fax

## 2017-06-26 LAB — CBC WITH DIFFERENTIAL/PLATELET
Basophils Absolute: 0 10*3/uL (ref 0.0–0.2)
Basos: 0 %
EOS (ABSOLUTE): 0.1 10*3/uL (ref 0.0–0.4)
Eos: 2 %
Hematocrit: 36.6 % (ref 34.0–46.6)
Hemoglobin: 12.4 g/dL (ref 11.1–15.9)
Immature Grans (Abs): 0 10*3/uL (ref 0.0–0.1)
Immature Granulocytes: 0 %
LYMPHS ABS: 1.1 10*3/uL (ref 0.7–3.1)
Lymphs: 31 %
MCH: 26.6 pg (ref 26.6–33.0)
MCHC: 33.9 g/dL (ref 31.5–35.7)
MCV: 78 fL — ABNORMAL LOW (ref 79–97)
MONOS ABS: 0.4 10*3/uL (ref 0.1–0.9)
Monocytes: 12 %
Neutrophils Absolute: 2 10*3/uL (ref 1.4–7.0)
Neutrophils: 55 %
Platelets: 285 10*3/uL (ref 150–379)
RBC: 4.67 x10E6/uL (ref 3.77–5.28)
RDW: 14.2 % (ref 12.3–15.4)
WBC: 3.6 10*3/uL (ref 3.4–10.8)

## 2017-06-26 LAB — COMPREHENSIVE METABOLIC PANEL
ALK PHOS: 46 IU/L (ref 39–117)
ALT: 21 IU/L (ref 0–32)
AST: 20 IU/L (ref 0–40)
Albumin/Globulin Ratio: 1.6 (ref 1.2–2.2)
Albumin: 4.7 g/dL (ref 3.5–5.5)
BUN/Creatinine Ratio: 13 (ref 9–23)
BUN: 10 mg/dL (ref 6–24)
Bilirubin Total: 0.5 mg/dL (ref 0.0–1.2)
CO2: 21 mmol/L (ref 20–29)
CREATININE: 0.78 mg/dL (ref 0.57–1.00)
Calcium: 9.8 mg/dL (ref 8.7–10.2)
Chloride: 102 mmol/L (ref 96–106)
GFR calc Af Amer: 101 mL/min/{1.73_m2} (ref >59)
GFR calc non Af Amer: 88 mL/min/{1.73_m2} (ref >59)
GLUCOSE: 86 mg/dL (ref 65–99)
Globulin, Total: 2.9 g/dL (ref 1.5–4.5)
Potassium: 4.6 mmol/L (ref 3.5–5.2)
SODIUM: 139 mmol/L (ref 134–144)
Total Protein: 7.6 g/dL (ref 6.0–8.5)

## 2017-06-26 LAB — LIPID PANEL
Chol/HDL Ratio: 5.2 ratio — ABNORMAL HIGH (ref 0.0–4.4)
Cholesterol, Total: 183 mg/dL (ref 100–199)
HDL: 35 mg/dL — AB (ref >39)
LDL Calculated: 110 mg/dL — ABNORMAL HIGH (ref 0–99)
Triglycerides: 192 mg/dL — ABNORMAL HIGH (ref 0–149)
VLDL Cholesterol Cal: 38 mg/dL (ref 5–40)

## 2017-06-26 LAB — HEMOGLOBIN A1C
ESTIMATED AVERAGE GLUCOSE: 120 mg/dL
HEMOGLOBIN A1C: 5.8 % — AB (ref 4.8–5.6)

## 2017-07-08 ENCOUNTER — Encounter: Payer: Self-pay | Admitting: Family Medicine

## 2017-10-10 ENCOUNTER — Encounter: Payer: Self-pay | Admitting: Physician Assistant

## 2017-10-10 ENCOUNTER — Ambulatory Visit (INDEPENDENT_AMBULATORY_CARE_PROVIDER_SITE_OTHER): Payer: BC Managed Care – PPO | Admitting: Physician Assistant

## 2017-10-10 VITALS — BP 132/82 | HR 83 | Temp 98.1°F | Resp 16 | Ht 70.0 in | Wt 209.0 lb

## 2017-10-10 DIAGNOSIS — M549 Dorsalgia, unspecified: Secondary | ICD-10-CM

## 2017-10-10 DIAGNOSIS — Z23 Encounter for immunization: Secondary | ICD-10-CM | POA: Diagnosis not present

## 2017-10-10 DIAGNOSIS — M62830 Muscle spasm of back: Secondary | ICD-10-CM

## 2017-10-10 LAB — POCT URINALYSIS DIP (MANUAL ENTRY)
BILIRUBIN UA: NEGATIVE mg/dL
Bilirubin, UA: NEGATIVE
Blood, UA: NEGATIVE
Glucose, UA: NEGATIVE mg/dL
Nitrite, UA: NEGATIVE
PH UA: 5.5 (ref 5.0–8.0)
SPEC GRAV UA: 1.02 (ref 1.010–1.025)
Urobilinogen, UA: 0.2 E.U./dL

## 2017-10-10 LAB — POC MICROSCOPIC URINALYSIS (UMFC): Mucus: ABSENT

## 2017-10-10 LAB — POCT URINE PREGNANCY: PREG TEST UR: NEGATIVE

## 2017-10-10 MED ORDER — CYCLOBENZAPRINE HCL 10 MG PO TABS: 5.0000 mg | ORAL_TABLET | Freq: Three times a day (TID) | ORAL | 0 refills | Status: DC | PRN

## 2017-10-10 MED ORDER — IBUPROFEN 600 MG PO TABS: 600.0000 mg | ORAL_TABLET | Freq: Three times a day (TID) | ORAL | 0 refills | Status: DC | PRN

## 2017-10-10 NOTE — Patient Instructions (Addendum)
Please make sure you are icing that area three times per day for 15 minutes.   Today you can start with heat for the next 24 hours.  But as you start doing stretches, you want to ice directly after, to keep inflammation from coming in to play.   The flexeril is sedating so please do not operate heavy machinery with this medication  Low Back Strain Rehab Ask your health care provider which exercises are safe for you. Do exercises exactly as told by your health care provider and adjust them as directed. It is normal to feel mild stretching, pulling, tightness, or discomfort as you do these exercises, but you should stop right away if you feel sudden pain or your pain gets worse. Do not begin these exercises until told by your health care provider. Stretching and range of motion exercises These exercises warm up your muscles and joints and improve the movement and flexibility of your back. These exercises also help to relieve pain, numbness, and tingling. Exercise A: Single knee to chest  1. Lie on your back on a firm surface with both legs straight. 2. Bend one of your knees. Use your hands to move your knee up toward your chest until you feel a gentle stretch in your lower back and buttock. ? Hold your leg in this position by holding onto the front of your knee. ? Keep your other leg as straight as possible. 3. Hold for __________ seconds. 4. Slowly return to the starting position. 5. Repeat with your other leg. Repeat __________ times. Complete this exercise __________ times a day. Exercise B: Prone extension on elbows  1. Lie on your abdomen on a firm surface. 2. Prop yourself up on your elbows. 3. Use your arms to help lift your chest up until you feel a gentle stretch in your abdomen and your lower back. ? This will place some of your body weight on your elbows. If this is uncomfortable, try stacking pillows under your chest. ? Your hips should stay down, against the surface that you are  lying on. Keep your hip and back muscles relaxed. 4. Hold for __________ seconds. 5. Slowly relax your upper body and return to the starting position. Repeat __________ times. Complete this exercise __________ times a day. Strengthening exercises These exercises build strength and endurance in your back. Endurance is the ability to use your muscles for a long time, even after they get tired. Exercise C: Pelvic tilt 1. Lie on your back on a firm surface. Bend your knees and keep your feet flat. 2. Tense your abdominal muscles. Tip your pelvis up toward the ceiling and flatten your lower back into the floor. ? To help with this exercise, you may place a small towel under your lower back and try to push your back into the towel. 3. Hold for __________ seconds. 4. Let your muscles relax completely before you repeat this exercise. Repeat __________ times. Complete this exercise __________ times a day. Exercise D: Alternating arm and leg raises  1. Get on your hands and knees on a firm surface. If you are on a hard floor, you may want to use padding to cushion your knees, such as an exercise mat. 2. Line up your arms and legs. Your hands should be below your shoulders, and your knees should be below your hips. 3. Lift your left leg behind you. At the same time, raise your right arm and straighten it in front of you. ? Do not lift  your leg higher than your hip. ? Do not lift your arm higher than your shoulder. ? Keep your abdominal and back muscles tight. ? Keep your hips facing the ground. ? Do not arch your back. ? Keep your balance carefully, and do not hold your breath. 4. Hold for __________ seconds. 5. Slowly return to the starting position and repeat with your right leg and your left arm. Repeat __________ times. Complete this exercise __________times a day. Exercise J: Single leg lower with bent knees 1. Lie on your back on a firm surface. 2. Tense your abdominal muscles and lift your  feet off the floor, one foot at a time, so your knees and hips are bent in an "L" shape (at about 90 degrees). ? Your knees should be over your hips and your lower legs should be parallel to the floor. 3. Keeping your abdominal muscles tense and your knee bent, slowly lower one of your legs so your toe touches the ground. 4. Lift your leg back up to return to the starting position. ? Do not hold your breath. ? Do not let your back arch. Keep your back flat against the ground. 5. Repeat with your other leg. Repeat __________ times. Complete this exercise __________ times a day. Posture and body mechanics  Body mechanics refers to the movements and positions of your body while you do your daily activities. Posture is part of body mechanics. Good posture and healthy body mechanics can help to relieve stress in your body's tissues and joints. Good posture means that your spine is in its natural S-curve position (your spine is neutral), your shoulders are pulled back slightly, and your head is not tipped forward. The following are general guidelines for applying improved posture and body mechanics to your everyday activities. Standing   When standing, keep your spine neutral and your feet about hip-width apart. Keep a slight bend in your knees. Your ears, shoulders, and hips should line up.  When you do a task in which you stand in one place for a long time, place one foot up on a stable object that is 2-4 inches (5-10 cm) high, such as a footstool. This helps keep your spine neutral. Sitting   When sitting, keep your spine neutral and keep your feet flat on the floor. Use a footrest, if necessary, and keep your thighs parallel to the floor. Avoid rounding your shoulders, and avoid tilting your head forward.  When working at a desk or a computer, keep your desk at a height where your hands are slightly lower than your elbows. Slide your chair under your desk so you are close enough to maintain good  posture.  When working at a computer, place your monitor at a height where you are looking straight ahead and you do not have to tilt your head forward or downward to look at the screen. Resting   When lying down and resting, avoid positions that are most painful for you.  If you have pain with activities such as sitting, bending, stooping, or squatting (flexion-based activities), lie in a position in which your body does not bend very much. For example, avoid curling up on your side with your arms and knees near your chest (fetal position).  If you have pain with activities such as standing for a long time or reaching with your arms (extension-based activities), lie with your spine in a neutral position and bend your knees slightly. Try the following positions: ? Lying on your side  with a pillow between your knees. ? Lying on your back with a pillow under your knees. Lifting   When lifting objects, keep your feet at least shoulder-width apart and tighten your abdominal muscles.  Bend your knees and hips and keep your spine neutral. It is important to lift using the strength of your legs, not your back. Do not lock your knees straight out.  Always ask for help to lift heavy or awkward objects. This information is not intended to replace advice given to you by your health care provider. Make sure you discuss any questions you have with your health care provider. Document Released: 11/25/2005 Document Revised: 08/01/2016 Document Reviewed: 09/06/2015 Elsevier Interactive Patient Education  2018 [employer].     IF you received an x-ray today, you will receive an invoice from [facility]. Please contact [facility] at [phone] with questions or concerns regarding your invoice.   IF you received labwork today, you will receive an invoice from [facility]. Please contact [facility] at [phone] with questions or concerns regarding your invoice.   Our billing staff  will not be able to assist you with questions regarding bills from these companies.  You will be contacted with the lab results as soon as they are available. The fastest way to get your results is to activate your My Chart account. Instructions are located on the last page of this paperwork. If you have not heard from [place] regarding the results in 2 weeks, please contact this office.

## 2017-10-10 NOTE — Progress Notes (Signed)
[facility] [address], [place] [phone]  Date:  10/10/2017   Name:  Kendra Saunders   DOB:  1966/05/31   MRN:  [id number]  PCP:  Ethelda ChickSmith, Kristi M, MD    History of Present Illness:  Kendra Saunders is a 51 y.o. female patient who presents to PCP with  Chief Complaint  Patient presents with  . Back Pain    2 days     2 days ago, she went to turned, she felt a slight pain.  She went about the day, but then yesterday the pain worsened throughout the day.  If she tries to walk too fast, she can feel a sudden pain shoot down the outside of her leg.  No swelling found at this time.  She has done nothing for the pain.     Patient Active Problem List   Diagnosis Date Noted  . Anxiety and depression 01/09/2016  . DUB (dysfunctional uterine bleeding) 01/09/2016  . Hypersomnolence 01/09/2016  . Family history of breast cancer in mother 01/09/2016  . Overweight (BMI 25.0-29.9) 10/23/2014  . 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 SICKLE CELL TRAIT 06/24/2007  . Migraine without aura 06/24/2007    Past Medical History:  Diagnosis Date  . Anxiety   . Asthma    childhood; last Albuterol use elementary school.  . Depression   . Migraine   . Seasonal allergies   . Sickle cell trait [facility](H[facility])     Past Surgical History:  Procedure Laterality Date  . EYE SURGERY     R orbital fracture  . HAMMER TOE SURGERY    . orbital right fracture  1995  . OVARIAN CYST REMOVAL    . TONSILLECTOMY    . TUBAL LIGATION      Social History  Substance Use Topics  . Smoking status: Never Smoker  . Smokeless tobacco: Never Used  . Alcohol use Yes     Comment: rarely    Family History  Problem Relation Age of Onset  . Cancer Mother 3139       breast cancer  . Cancer Father 3574       brain cancer; prostate cancer  . Heart disease Brother 39       heart attack  . Cancer Paternal Grandfather        breast cancer  . Lupus Daughter   . Cancer Sister        breast cancer  .  Cancer Sister 3755       breast cancer  . Colon cancer Neg Hx     Allergies  Allergen Reactions  . Peanuts [Peanut Oil]     Throat itches and she has stomach pains    Medication list has been reviewed and updated.  Current Outpatient Prescriptions on File Prior to Visit  Medication Sig Dispense Refill  . FLUoxetine (PROZAC) 20 MG tablet TAKE 2 TABLETS(40 MG) BY MOUTH DAILY 60 tablet 5  . HYDROcodone-acetaminophen (NORCO/VICODIN) 5-325 MG tablet Take 1 tablet by mouth every 4 (four) hours as needed for moderate pain.    . rizatriptan (MAXALT-MLT) 10 MG disintegrating tablet Take 1 tablet (10 mg total) by mouth as needed for migraine. May repeat in 2 hours if needed (Patient not taking: Reported on 10/10/2017) 8 tablet 3  . traMADol (ULTRAM) 50 MG tablet Take 1 tablet (50 mg total) by mouth every 8 (eight) hours as needed. (Patient not taking: Reported on 10/10/2017) 12 tablet 0   Current Facility-Administered Medications on  File Prior to Visit  Medication Dose Route Frequency Provider Last Rate Last Dose  . 0.9 %  sodium chloride infusion  500 mL Intravenous Continuous Nandigam, Kavitha V, MD        ROS ROS otherwise unremarkable unless listed above.  Physical Examination: BP 132/82   Pulse 83   Temp 98.1 F (36.7 C) (Oral)   Resp 16   Ht 5\' 10"  (1.778 m)   Wt 209 lb (94.8 kg)   SpO2 99%   BMI 29.99 kg/m  Ideal Body Weight: Weight in (lb) to have BMI = 25: 173.9  Physical Exam  Constitutional: She is oriented to person, place, and time. She appears well-developed and well-nourished. No distress.  HENT:  Head: Normocephalic and atraumatic.  Right Ear: External ear normal.  Left Ear: External ear normal.  Eyes: Conjunctivae and EOM are normal. Pupils are equal, round, and reactive to light.  Cardiovascular: Normal rate.  Pulmonary/Chest: Effort normal. No respiratory distress.  Musculoskeletal:       Lumbar back: She exhibits decreased range of motion (forward flexion  minimally decreased) and spasm. She exhibits normal pulse.  Lower right lumbar region with spasm appreciated.  No spinous tenderness.  No erythema  Neurological: She is alert and oriented to person, place, and time.  Skin: She is not diaphoretic.  Psychiatric: She has a normal mood and affect. Her behavior is normal.     Assessment and Plan: Kendra Saunders is a 51 y.o. female who is here today for cc of  Chief Complaint  Patient presents with  . Back Pain    2 days  advised ice, stretch regimen. Warned of sedative side effects of msk relaxants nsaid use precautions advised.  Spasm of muscle of lower back - Plan: cyclobenzaprine (FLEXERIL) 10 MG tablet  Need for influenza vaccination - Plan: Flu Vaccine QUAD 36+ mos IM  Acute back pain, unspecified back location, unspecified back pain laterality - Plan: POCT urine pregnancy, POCT urinalysis dipstick, POCT Microscopic Urinalysis ([facility])  Trena Platt, PA-C [facility] 11/4/20183:33 PM

## 2017-10-10 NOTE — Progress Notes (Deleted)
PRIMARY CARE AT [facility] [address] [place] [address] [phone]  Date:  10/10/2017   Name:  Kendra Saunders   DOB:  [birth year]   MRN:  [id number]  PCP:  Ethelda Chick, MD    History of Present Illness:  Kendra Saunders is a 51 y.o. female patient who presents to PCP with  Chief Complaint  Patient presents with  . Back Pain    2 days       Patient Active Problem List   Diagnosis Date Noted  . Anxiety and depression 01/09/2016  . DUB (dysfunctional uterine bleeding) 01/09/2016  . Hypersomnolence 01/09/2016  . Family history of breast cancer in mother 01/09/2016  . Overweight (BMI 25.0-29.9) 10/23/2014  . 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 SICKLE CELL TRAIT 06/24/2007  . Migraine without aura 06/24/2007    Past Medical History:  Diagnosis Date  . Anxiety   . Asthma    childhood; last Albuterol use elementary school.  . Depression   . Migraine   . Seasonal allergies   . Sickle cell trait [facility])     Past Surgical History:  Procedure Laterality Date  . EYE SURGERY     R orbital fracture  . HAMMER TOE SURGERY    . orbital right fracture  1995  . OVARIAN CYST REMOVAL    . TONSILLECTOMY    . TUBAL LIGATION      Social History  Substance Use Topics  . Smoking status: Never Smoker  . Smokeless tobacco: Never Used  . Alcohol use Yes     Comment: rarely    Family History  Problem Relation Age of Onset  . Cancer Mother 85       breast cancer  . Cancer Father 48       brain cancer; prostate cancer  . Heart disease Brother 39       heart attack  . Cancer Paternal Grandfather        breast cancer  . Lupus Daughter   . Cancer Sister        breast cancer  . Cancer Sister 17       breast cancer  . Colon cancer Neg Hx     Allergies  Allergen Reactions  . Peanuts [Peanut Oil]     Throat itches and she has stomach pains    Medication list has been reviewed and updated.  Current Outpatient Prescriptions on File Prior to Visit  Medication Sig Dispense  Refill  . FLUoxetine (PROZAC) 20 MG tablet TAKE 2 TABLETS(40 MG) BY MOUTH DAILY 60 tablet 5  . HYDROcodone-acetaminophen (NORCO/VICODIN) 5-325 MG tablet Take 1 tablet by mouth every 4 (four) hours as needed for moderate pain.    . rizatriptan (MAXALT-MLT) 10 MG disintegrating tablet Take 1 tablet (10 mg total) by mouth as needed for migraine. May repeat in 2 hours if needed (Patient not taking: Reported on 10/10/2017) 8 tablet 3  . traMADol (ULTRAM) 50 MG tablet Take 1 tablet (50 mg total) by mouth every 8 (eight) hours as needed. (Patient not taking: Reported on 10/10/2017) 12 tablet 0   Current Facility-Administered Medications on File Prior to Visit  Medication Dose Route Frequency Provider Last Rate Last Dose  . 0.9 %  sodium chloride infusion  500 mL Intravenous Continuous Nandigam, Kavitha V, MD        ROS ROS otherwise unremarkable unless listed above.  Physical Examination: BP 132/82   Pulse 83   Temp 98.1 F (36.7 C) (Oral)   Resp  16   Ht 5\' 10"  (1.778 m)   Wt 209 lb (94.8 kg)   SpO2 99%   BMI 29.99 kg/m  Ideal Body Weight: Weight in (lb) to have BMI = 25: 173.9  Physical Exam   Assessment and Plan: Kendra Saunders is a 51 y.o. female who is here today  1. Need for influenza vaccination *** - Flu Vaccine QUAD 36+ mos IM   Trena PlattStephanie Hansel Devan, PA-C [facility] 10/10/2017 5:07 PM

## 2017-10-17 ENCOUNTER — Other Ambulatory Visit: Payer: Self-pay | Admitting: Physician Assistant

## 2017-10-22 ENCOUNTER — Encounter: Payer: Self-pay | Admitting: Physician Assistant

## 2017-10-22 ENCOUNTER — Ambulatory Visit: Payer: BC Managed Care – PPO | Admitting: Physician Assistant

## 2017-10-22 VITALS — BP 136/82 | HR 93 | Temp 98.7°F | Resp 18 | Ht 70.0 in | Wt 210.6 lb

## 2017-10-22 DIAGNOSIS — M549 Dorsalgia, unspecified: Secondary | ICD-10-CM

## 2017-10-22 DIAGNOSIS — S0181XA Laceration without foreign body of other part of head, initial encounter: Secondary | ICD-10-CM | POA: Diagnosis not present

## 2017-10-22 DIAGNOSIS — M62830 Muscle spasm of back: Secondary | ICD-10-CM

## 2017-10-22 NOTE — Patient Instructions (Signed)
     IF you received an x-ray today, you will receive an invoice from [facility]. Please contact [facility] at [phone] with questions or concerns regarding your invoice.   IF you received labwork today, you will receive an invoice from [facility]. Please contact [facility] at [phone] with questions or concerns regarding your invoice.   Our billing staff will not be able to assist you with questions regarding bills from these companies.  You will be contacted with the lab results as soon as they are available. The fastest way to get your results is to activate your My Chart account. Instructions are located on the last page of this paperwork. If you have not heard from us regarding the results in 2 weeks, please contact this office.     

## 2017-10-22 NOTE — Progress Notes (Signed)
[facility] AT [facility] [address], [place] [id number] [phone]  Date:  10/22/2017   Name:  Kendra Saunders   DOB:  12/12/1965   MRN:  [id number]  PCP:  Ethelda ChickSmith, Kristi M, MD    History of Present Illness:  Kendra Saunders is a 51 y.o. female patient who presents to PCP with  Chief Complaint  Patient presents with  . Back Pain    follow up      Her back is doing a lot better.  She reports that she was able to go skating.  This is where she fell 3 days ago, landing on her face.  She had a cut through her lower lip and hit her tooth.  She notes some pain to the front teeth.   She went to the dentist, and this evaluation went fine, she reports.   She has applied neosporin and ran hydrogen peroxide on her lip.  Dentist reported her not to use the hydrogen peroxide anymore.  She has not.   Patient Active Problem List   Diagnosis Date Noted  . Anxiety and depression 01/09/2016  . DUB (dysfunctional uterine bleeding) 01/09/2016  . Hypersomnolence 01/09/2016  . Family history of breast cancer in mother 01/09/2016  . Overweight (BMI 25.0-29.9) 10/23/2014  . 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 SICKLE CELL TRAIT 06/24/2007  . Migraine without aura 06/24/2007    Past Medical History:  Diagnosis Date  . Anxiety   . Asthma    childhood; last Albuterol use elementary school.  . Depression   . Migraine   . Seasonal allergies   . Sickle cell trait [facility](HCC)     Past Surgical History:  Procedure Laterality Date  . EYE SURGERY     R orbital fracture  . HAMMER TOE SURGERY    . orbital right fracture  1995  . OVARIAN CYST REMOVAL    . TONSILLECTOMY    . TUBAL LIGATION      Social History   Tobacco Use  . Smoking status: Never Smoker  . Smokeless tobacco: Never Used  Substance Use Topics  . Alcohol use: Yes    Comment: rarely  . Drug use: No    Family History  Problem Relation Age of Onset  . Cancer Mother [id number]       breast cancer  . Cancer Father 974       brain cancer;  prostate cancer  . Heart disease Brother 39       heart attack  . Cancer Paternal Grandfather        breast cancer  . Lupus Daughter   . Cancer Sister        breast cancer  . Cancer Sister 1655       breast cancer  . Colon cancer Neg Hx     Allergies  Allergen Reactions  . Peanuts [Peanut Oil]     Throat itches and she has stomach pains    Medication list has been reviewed and updated.  Current Outpatient Medications on File Prior to Visit  Medication Sig Dispense Refill  . FLUoxetine (PROZAC) 20 MG tablet TAKE 2 TABLETS(40 MG) BY MOUTH DAILY 60 tablet 5  . ibuprofen (ADVIL,MOTRIN) 600 MG tablet Take 1 tablet (600 mg total) by mouth every 8 (eight) hours as needed. 30 tablet 0  . rizatriptan (MAXALT-MLT) 10 MG disintegrating tablet Take 1 tablet (10 mg total) by mouth as needed for migraine. May repeat in 2 hours if needed 8 tablet 3  . cyclobenzaprine (  FLEXERIL) 10 MG tablet Take 0.5-1 tablets (5-10 mg total) by mouth 3 (three) times daily as needed. (Patient not taking: Reported on 10/22/2017) 30 tablet 0  . HYDROcodone-acetaminophen (NORCO/VICODIN) 5-325 MG tablet Take 1 tablet by mouth every 4 (four) hours as needed for moderate pain.    . traMADol (ULTRAM) 50 MG tablet Take 1 tablet (50 mg total) by mouth every 8 (eight) hours as needed. (Patient not taking: Reported on 10/10/2017) 12 tablet 0   Current Facility-Administered Medications on File Prior to Visit  Medication Dose Route Frequency Provider Last Rate Last Dose  . 0.9 %  sodium chloride infusion  500 mL Intravenous Continuous Nandigam, Kavitha V, MD        ROS ROS otherwise unremarkable unless listed above.  Physical Examination: BP 136/82   Pulse 93   Temp 98.7 F (37.1 C) (Oral)   Resp 18   Ht 5\' 10"  (1.778 m)   Wt 210 lb 9.6 oz (95.5 kg)   SpO2 98%   BMI 30.22 kg/m  Ideal Body Weight: Weight in (lb) to have BMI = 25: 173.9  Physical Exam  Constitutional: She is oriented to person, place, and time. She  appears well-developed and well-nourished. No distress.  HENT:  Head: Normocephalic and atraumatic.  Right Ear: External ear normal.  Left Ear: External ear normal.  1cm laceration at her inner lip without purulent drainage, or erythema.  The outside of the lip has dried sanguinous material and healing without erythema.  This is closed.  Teeth and gums appear normal without looseness.  b  Eyes: Conjunctivae and EOM are normal. Pupils are equal, round, and reactive to light.  Cardiovascular: Normal rate.  Pulmonary/Chest: Effort normal. No respiratory distress.  Musculoskeletal:       Lumbar back: She exhibits normal range of motion.  Neurological: She is alert and oriented to person, place, and time. Coordination and gait normal.  Skin: She is not diaphoretic.  Psychiatric: She has a normal mood and affect. Her behavior is normal.     Assessment and Plan: Kendra Saunders is a 51 y.o. female who is here today for cc of  Chief Complaint  Patient presents with  . Back Pain    follow up    Advised to continue neosporin.  This is too late for closure, and is healing.  Back pain resolved.   Rtc as needed.  Advised alarming symptoms with laceration to warrant immediate return.   Spasm of muscle of lower back  Acute back pain, unspecified back location, unspecified back pain laterality  Facial laceration, initial encounter  Trena PlattStephanie Brixton Franko, PA-C [facility] 11/20/201811:00 AM

## 2017-12-29 ENCOUNTER — Other Ambulatory Visit: Payer: Self-pay

## 2017-12-29 ENCOUNTER — Ambulatory Visit: Payer: BC Managed Care – PPO | Admitting: Family Medicine

## 2017-12-29 ENCOUNTER — Encounter: Payer: Self-pay | Admitting: Family Medicine

## 2017-12-29 VITALS — BP 130/82 | HR 79 | Temp 98.0°F | Resp 16 | Ht 71.26 in | Wt 214.0 lb

## 2017-12-29 DIAGNOSIS — G43009 Migraine without aura, not intractable, without status migrainosus: Secondary | ICD-10-CM | POA: Diagnosis not present

## 2017-12-29 DIAGNOSIS — E785 Hyperlipidemia, unspecified: Secondary | ICD-10-CM

## 2017-12-29 DIAGNOSIS — E010 Iodine-deficiency related diffuse (endemic) goiter: Secondary | ICD-10-CM | POA: Diagnosis not present

## 2017-12-29 DIAGNOSIS — G5622 Lesion of ulnar nerve, left upper limb: Secondary | ICD-10-CM

## 2017-12-29 DIAGNOSIS — F329 Major depressive disorder, single episode, unspecified: Secondary | ICD-10-CM

## 2017-12-29 DIAGNOSIS — E7439 Other disorders of intestinal carbohydrate absorption: Secondary | ICD-10-CM

## 2017-12-29 DIAGNOSIS — F419 Anxiety disorder, unspecified: Secondary | ICD-10-CM

## 2017-12-29 NOTE — Progress Notes (Signed)
Subjective:    Patient ID: Kendra GurneyDecolia Saunders, female    DOB: January 13, 1966, 52 y.o.   MRN: [id number]  12/29/2017  Depression (6 month follow-up ) and Anxiety    HPI This 52 y.o. female presents for six-month follow-up for depression and anxiety, migraines, glucose intolerance, dyslipidemia.  Patient reports good compliance with medication, good tolerance to medication, and good symptom control.  Doing well emotionally overall.  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   L hand finger numbness 2nd-4th fingers;  LEFT hand dominant.  Always on computer at work; putting in grades. No neck pain.  Denies chest pain, shortness of breath, diaphoresis, nausea associated with left hand numbness.  Symptoms do not occur with exercise or exertion.    Started back school; adding a administration added to masters; plans to leave class room.  Burned out. Thirteenth year in the classroom; high school 9th-12th. Freshman.  Three classes.  Had stopped eating meat; only eating fish.  Just started trying eating meat one month ago.  Upset stomach; GI distress.  Was roller skating, pickle ball, walking.    BP Readings from Last 3 Encounters:  12/29/17 130/82  10/22/17 136/82  10/10/17 132/82   Wt Readings from Last 3 Encounters:  12/29/17 214 lb (97.1 kg)  10/22/17 210 lb 9.6 oz (95.5 kg)  10/10/17 209 lb (94.8 kg)   Immunization History  Administered Date(s) Administered  . Influenza Split 10/10/2015, 10/19/2016  . Influenza,inj,Quad PF,6+ Mos 09/18/2013, 10/19/2014, 10/10/2017  . Influenza-Unspecified 09/30/2017  . Tdap 12/25/2015    Review of Systems  Constitutional: Negative for chills, diaphoresis, fatigue and fever.  Eyes: Negative for visual disturbance.  Respiratory: Negative for cough and shortness of breath.   Cardiovascular: Negative for chest pain, palpitations and leg swelling.    Gastrointestinal: Negative for abdominal distention, abdominal pain, anal bleeding, blood in stool, constipation, diarrhea, nausea, rectal pain and vomiting.  Endocrine: Negative for cold intolerance, heat intolerance, polydipsia, polyphagia and polyuria.  Musculoskeletal: Negative for arthralgias, neck pain and neck stiffness.  Neurological: Positive for numbness. Negative for dizziness, tremors, seizures, syncope, facial asymmetry, speech difficulty, weakness, light-headedness and headaches.  Psychiatric/Behavioral: Positive for dysphoric mood. Negative for self-injury, sleep disturbance and suicidal ideas. The patient is nervous/anxious.     Past Medical History:  Diagnosis Date  . Anxiety   . Asthma    childhood; last Albuterol use elementary school.  . Depression   . Migraine   . Seasonal allergies   . Sickle cell trait [facility](HCC)    Past Surgical History:  Procedure Laterality Date  . EYE SURGERY     R orbital fracture  . HAMMER TOE SURGERY    . orbital right fracture  1995  . OVARIAN CYST REMOVAL    . TONSILLECTOMY    . TUBAL LIGATION     Allergies  Allergen Reactions  . Peanuts [Peanut Oil]     Throat itches and she has stomach pains   Current Outpatient Medications on File Prior to Visit  Medication Sig Dispense Refill  . rizatriptan (MAXALT-MLT) 10 MG disintegrating tablet Take 10 mg by mouth as needed for migraine. May repeat in 2 hours if needed     Current Facility-Administered Medications on File Prior to Visit  Medication Dose Route Frequency Provider Last Rate Last Dose  . 0.9 %  sodium chloride infusion  500 mL Intravenous Continuous Nandigam, Eleonore ChiquitoKavitha V, MD  Social History   Socioeconomic History  . Marital status: Single    Spouse name: n/a  . Number of children: 2  . Years of education: Master's   . Highest education level: Not on file  Social Needs  . Financial resource strain: Not on file  . Food insecurity - worry: Not on file  . Food  insecurity - inability: Not on file  . Transportation needs - medical: Not on file  . Transportation needs - non-medical: Not on file  Occupational History  . Occupation: Magazine features editor: [employer]  Tobacco Use  . Smoking status: Never Smoker  . Smokeless tobacco: Never Used  Substance and Sexual Activity  . Alcohol use: Yes    Comment: rarely  . Drug use: No  . Sexual activity: Yes    Partners: Male  Other Topics Concern  . Not on file  Social History Narrative   Marital status: divorced since 2002; +dating seriously x 7 years.      Children:  2 children (28, 20); no grandchildren.      Lives:  with her two children.      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      Tobacco: none       Alcohol: wine once per week.       Exercise:  Walking 3 days per week      Seatbelt: 100%      Guns: none      Sexual activity:  Total sexual partners = 20.  No STDs.  Last STD testing never.   Date males.     Family History  Problem Relation Age of Onset  . Cancer Mother 93       breast cancer  . Cancer Father 66       brain cancer; prostate cancer  . Heart disease Brother 39       heart attack  . Cancer Paternal Grandfather        breast cancer  . Lupus Daughter   . Cancer Sister        breast cancer  . Cancer Sister 45       breast cancer  . Colon cancer Neg Hx        Objective:    BP 130/82   Pulse 79   Temp 98 F (36.7 C) (Oral)   Resp 16   Ht 5' 11.26" (1.81 m)   Wt 214 lb (97.1 kg)   SpO2 100%   BMI 29.63 kg/m  Physical Exam  Constitutional: She is oriented to person, place, and time. She appears well-developed and well-nourished. No distress.  HENT:  Head: Normocephalic and atraumatic.  Right Ear: External ear normal.  Left Ear: External ear normal.  Nose: Nose normal.  Mouth/Throat: Oropharynx is clear and moist.  Eyes: Conjunctivae and EOM are normal. Pupils are equal, round, and reactive to light.    Neck: Normal range of motion. Neck supple. Carotid bruit is not present. Thyromegaly present.  Right greater than left thyromegaly appreciated on exam.  No nodules.  Cardiovascular: Normal rate, regular rhythm, normal heart sounds and intact distal pulses. Exam reveals no gallop and no friction rub.  No murmur heard. Pulmonary/Chest: Effort normal and breath sounds normal. She has no wheezes. She has no rales.  Abdominal: Soft. Bowel sounds are normal. She exhibits no distension and no mass. There is no tenderness. There is no rebound and no guarding.  Musculoskeletal:  Right shoulder: Normal.       Left shoulder: Normal.       Left elbow: Normal.       Left wrist: Normal.       Left hand: Normal. Normal sensation noted. Normal strength noted.  Lymphadenopathy:    She has no cervical adenopathy.  Neurological: She is alert and oriented to person, place, and time. No cranial nerve deficit.  Skin: Skin is warm and dry. No rash noted. She is not diaphoretic. No erythema. No pallor.  Psychiatric: She has a normal mood and affect. Her behavior is normal.   No results found. Depression screen [facility] 2/9 12/29/2017 12/29/2017 10/10/2017 06/25/2017 05/30/2017  Decreased Interest 0 0 0 1 0  Down, Depressed, Hopeless 0 0 0 1 0  PHQ - 2 Score 0 0 0 2 0  Altered sleeping - - - 0 -  Tired, decreased energy - - - 0 -  Change in appetite - - - 0 -  Feeling bad or failure about yourself  - - - 0 -  Trouble concentrating - - - 0 -  Moving slowly or fidgety/restless - - - 0 -  Suicidal thoughts - - - 0 -  PHQ-9 Score - - - 2 -  Difficult doing work/chores - - - - -   Fall Risk  12/29/2017 12/29/2017 10/10/2017 06/25/2017 05/30/2017  Falls in the past year? No No No No No        Assessment & Plan:   1. Anxiety and depression   2. Migraine without aura and without status migrainosus, not intractable   3. Dyslipidemia   4. Glucose intolerance   5. Thyromegaly   6. 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  Refill provided.  Patient has returned to schoolWith plans to leave the classroom in the upcoming year.    Migraines well controlled at this time.  Dyslipidemia:  I recommend weight loss, exercise, and low-cholesterol low-fat food choices.  I recommend limiting red meat to once per week; I recommend limiting fried foods to once per month.  Glucose intolerance:  I recommend weight loss, exercise, and low-carbohydrate low-sugar food choices. You should AVOID: regular sodas, sweetened tea, fruit juices.  You should LIMIT: breads, pastas, rice, potatoes, and desserts/sweets.  I would recommend limiting your total carbohydrate intake per meal to 45 grams; I would limit your total carbohydrate intake per snack to 30 grams.  I would also have a goal of 60 grams of protein intake per day; this would equal 10-15 grams of protein per meal and 5-10 grams of protein per snack.  Thyromegaly: New onset.  Recent thyroid labs normal.  Refer for thyroid ultrasound.  New onset left ulnar neuropathy: Versus carpal tunnel syndrome. Consistent with excessive computer work.  Recommend rest.  Wrist splint provided to wear at bedtime.  If no improvement in 4-6 weeks, contact provider for referral to orthopedics.  Orders Placed This Encounter  Procedures  . US THYROID    Epic order WT 209/NO NEEDS INS BCBS SB W/PT    Standing Status:   Future    Standing Expiration Date:   02/27/2019    Order Specific Question:   Reason for Exam (SYMPTOM  OR DIAGNOSIS REQUIRED)    Answer:   R thyroid enlargement    Order Specific Question:   Preferred imaging location?    Answer:   [facility]  . CBC with  Differential/Platelet  . Comprehensive metabolic panel  . Hemoglobin A1c  . T4, free  . TSH  . Splint wrist    velcro style    Scheduling Instructions:     LEFT   Meds ordered this encounter  Medications  . FLUoxetine (PROZAC) 20 MG  tablet    Sig: TAKE 2 TABLETS(40 MG) BY MOUTH DAILY    Dispense:  60 tablet    Refill:  5    Return in about 6 months (around 06/28/2018) for follow-up chronic medical conditions.   Sheray Grist Paulita Fujita, M.D. Primary Care at [facility] previously [facility] [address] [place], [place]  [address] [phone] phone [phone] fax

## 2017-12-29 NOTE — Patient Instructions (Addendum)
IF you received an x-ray today, you will receive an invoice from [facility]. Please contact [facility] at [phone] with questions or concerns regarding your invoice.   IF you received labwork today, you will receive an invoice from [facility]. Please contact [facility] at [phone] with questions or concerns regarding your invoice.   Our billing staff will not be able to assist you with questions regarding bills from these companies.  You will be contacted with the lab results as soon as they are available. The fastest way to get your results is to activate your My Chart account. Instructions are located on the last page of this paperwork. If you have not heard from [place] regarding the results in 2 weeks, please contact this office.      Carpal Tunnel Syndrome Carpal tunnel syndrome is a condition that causes pain in your hand and arm. The carpal tunnel is a narrow area located on the palm side of your wrist. Repeated wrist motion or certain diseases may cause swelling within the tunnel. This swelling pinches the main nerve in the wrist (median nerve). What are the causes? This condition may be caused by:  Repeated wrist motions.  Wrist injuries.  Arthritis.  A cyst or tumor in the carpal tunnel.  Fluid buildup during pregnancy.  Sometimes the cause of this condition is not known. What increases the risk? This condition is more likely to develop in:  People who have jobs that cause them to repeatedly move their wrists in the same motion, such as Health visitor.  Women.  People with certain conditions, such as: ? Diabetes. ? Obesity. ? An underactive thyroid (hypothyroidism). ? Kidney failure.  What are the signs or symptoms? Symptoms of this condition include:  A tingling feeling in your fingers, especially in your thumb, index, and middle fingers.  Tingling or numbness in your hand.  An aching feeling in your entire arm, especially when  your wrist and elbow are bent for long periods of time.  Wrist pain that goes up your arm to your shoulder.  Pain that goes down into your palm or fingers.  A weak feeling in your hands. You may have trouble grabbing and holding items.  Your symptoms may feel worse during the night. How is this diagnosed? This condition is diagnosed with a medical history and physical exam. You may also have tests, including:  An electromyogram (EMG). This test measures electrical signals sent by your nerves into the muscles.  X-rays.  How is this treated? Treatment for this condition includes:  Lifestyle changes. It is important to stop doing or modify the activity that caused your condition.  Physical or occupational therapy.  Medicines for pain and inflammation. This may include medicine that is injected into your wrist.  A wrist splint.  Surgery.  Follow these instructions at home: If you have a splint:  Wear it as told by your health care provider. Remove it only as told by your health care provider.  Loosen the splint if your fingers become numb and tingle, or if they turn cold and blue.  Keep the splint clean and dry. General instructions  Take over-the-counter and prescription medicines only as told by your health care provider.  Rest your wrist from any activity that may be causing your pain. If your condition is work related, talk to your employer about changes that can be made, such as getting a wrist pad to use while typing.  If directed, apply ice to the  painful area: ? Put ice in a plastic bag. ? Place a towel between your skin and the bag. ? Leave the ice on for 20 minutes, 2-3 times per day.  Keep all follow-up visits as told by your health care provider. This is important.  Do any exercises as told by your health care provider, physical therapist, or occupational therapist. Contact a health care provider if:  You have new symptoms.  Your pain is not controlled  with medicines.  Your symptoms get worse. This information is not intended to replace advice given to you by your health care provider. Make sure you discuss any questions you have with your health care provider. Document Released: 11/22/2000 Document Revised: 04/04/2016 Document Reviewed: 04/12/2015 Elsevier Interactive Patient Education  [employer]2018 [employer] Inc.

## 2017-12-30 ENCOUNTER — Ambulatory Visit: Payer: BC Managed Care – PPO | Admitting: Family Medicine

## 2017-12-30 LAB — CBC WITH DIFFERENTIAL/PLATELET
Basophils Absolute: 0 10*3/uL (ref 0.0–0.2)
Basos: 0 %
EOS (ABSOLUTE): 0.1 10*3/uL (ref 0.0–0.4)
EOS: 2 %
HEMATOCRIT: 39.6 % (ref 34.0–46.6)
Hemoglobin: 13.2 g/dL (ref 11.1–15.9)
IMMATURE GRANS (ABS): 0 10*3/uL (ref 0.0–0.1)
IMMATURE GRANULOCYTES: 0 %
LYMPHS: 28 %
Lymphocytes Absolute: 1.8 10*3/uL (ref 0.7–3.1)
MCH: 27.3 pg (ref 26.6–33.0)
MCHC: 33.3 g/dL (ref 31.5–35.7)
MCV: 82 fL (ref 79–97)
MONOS ABS: 0.8 10*3/uL (ref 0.1–0.9)
Monocytes: 12 %
NEUTROS PCT: 58 %
Neutrophils Absolute: 3.6 10*3/uL (ref 1.4–7.0)
PLATELETS: 331 10*3/uL (ref 150–379)
RBC: 4.84 x10E6/uL (ref 3.77–5.28)
RDW: 14.8 % (ref 12.3–15.4)
WBC: 6.3 10*3/uL (ref 3.4–10.8)

## 2017-12-30 LAB — COMPREHENSIVE METABOLIC PANEL
ALT: 18 IU/L (ref 0–32)
AST: 19 IU/L (ref 0–40)
Albumin/Globulin Ratio: 1.7 (ref 1.2–2.2)
Albumin: 4.8 g/dL (ref 3.5–5.5)
Alkaline Phosphatase: 57 IU/L (ref 39–117)
BUN/Creatinine Ratio: 9 (ref 9–23)
BUN: 7 mg/dL (ref 6–24)
Bilirubin Total: 0.3 mg/dL (ref 0.0–1.2)
CALCIUM: 9.8 mg/dL (ref 8.7–10.2)
CO2: 21 mmol/L (ref 20–29)
CREATININE: 0.74 mg/dL (ref 0.57–1.00)
Chloride: 105 mmol/L (ref 96–106)
GFR calc Af Amer: 108 mL/min/{1.73_m2} (ref >59)
GFR, EST NON AFRICAN AMERICAN: 93 mL/min/{1.73_m2} (ref >59)
GLOBULIN, TOTAL: 2.8 g/dL (ref 1.5–4.5)
Glucose: 80 mg/dL (ref 65–99)
Potassium: 4.6 mmol/L (ref 3.5–5.2)
Sodium: 142 mmol/L (ref 134–144)
TOTAL PROTEIN: 7.6 g/dL (ref 6.0–8.5)

## 2017-12-30 LAB — HEMOGLOBIN A1C
Est. average glucose Bld gHb Est-mCnc: 128 mg/dL
Hgb A1c MFr Bld: 6.1 % — ABNORMAL HIGH (ref 4.8–5.6)

## 2017-12-30 LAB — T4, FREE: FREE T4: 1.12 ng/dL (ref 0.82–1.77)

## 2017-12-30 LAB — TSH: TSH: 1.96 u[IU]/mL (ref 0.450–4.500)

## 2018-01-02 MED ORDER — FLUOXETINE HCL 20 MG PO TABS: ORAL_TABLET | ORAL | 5 refills | Status: DC

## 2018-01-05 ENCOUNTER — Ambulatory Visit
Admission: RE | Admit: 2018-01-05 | Discharge: 2018-01-05 | Disposition: A | Discharge status: Home / Self Care | Payer: BC Managed Care – PPO | Source: Ambulatory Visit | Attending: Family Medicine | Admitting: Family Medicine

## 2018-01-05 DIAGNOSIS — E010 Iodine-deficiency related diffuse (endemic) goiter: Secondary | ICD-10-CM

## 2018-01-07 ENCOUNTER — Other Ambulatory Visit: Payer: Self-pay | Admitting: Family Medicine

## 2018-01-07 DIAGNOSIS — E041 Nontoxic single thyroid nodule: Secondary | ICD-10-CM

## 2018-01-14 NOTE — Progress Notes (Signed)
Called pt to confirm if she received her results yet but I got her VM. Results given on VM per release and will be mailing a copy of results.

## 2018-02-05 ENCOUNTER — Other Ambulatory Visit (HOSPITAL_COMMUNITY)
Admission: RE | Admit: 2018-02-05 | Discharge: 2018-02-05 | Disposition: A | Discharge status: Home / Self Care | Payer: BC Managed Care – PPO | Source: Ambulatory Visit | Attending: Radiology | Admitting: Radiology

## 2018-02-05 ENCOUNTER — Ambulatory Visit
Admission: RE | Admit: 2018-02-05 | Discharge: 2018-02-05 | Disposition: A | Discharge status: Home / Self Care | Payer: BC Managed Care – PPO | Source: Ambulatory Visit | Attending: Family Medicine | Admitting: Family Medicine

## 2018-02-05 DIAGNOSIS — E041 Nontoxic single thyroid nodule: Secondary | ICD-10-CM | POA: Diagnosis not present

## 2018-03-03 ENCOUNTER — Encounter: Payer: Self-pay | Admitting: Family Medicine

## 2018-03-03 DIAGNOSIS — E041 Nontoxic single thyroid nodule: Secondary | ICD-10-CM

## 2018-03-14 ENCOUNTER — Encounter: Payer: Self-pay | Admitting: Family Medicine

## 2018-03-18 ENCOUNTER — Encounter: Payer: Self-pay | Admitting: Physician Assistant

## 2018-03-20 ENCOUNTER — Other Ambulatory Visit: Payer: Self-pay | Admitting: Family Medicine

## 2018-03-31 ENCOUNTER — Encounter: Payer: Self-pay | Admitting: Family Medicine

## 2018-05-04 ENCOUNTER — Encounter: Payer: Self-pay | Admitting: Family Medicine

## 2018-05-17 ENCOUNTER — Encounter: Payer: Self-pay | Admitting: Family Medicine

## 2018-05-18 ENCOUNTER — Ambulatory Visit: Payer: BC Managed Care – PPO | Admitting: Family Medicine

## 2018-07-01 ENCOUNTER — Encounter: Payer: Self-pay | Admitting: Family Medicine

## 2018-07-01 ENCOUNTER — Other Ambulatory Visit: Payer: Self-pay

## 2018-07-01 ENCOUNTER — Ambulatory Visit: Payer: BC Managed Care – PPO | Admitting: Family Medicine

## 2018-07-01 VITALS — BP 120/70 | HR 102 | Temp 98.8°F | Resp 16 | Ht 70.08 in | Wt 212.0 lb

## 2018-07-01 DIAGNOSIS — G43009 Migraine without aura, not intractable, without status migrainosus: Secondary | ICD-10-CM

## 2018-07-01 DIAGNOSIS — E7439 Other disorders of intestinal carbohydrate absorption: Secondary | ICD-10-CM | POA: Diagnosis not present

## 2018-07-01 DIAGNOSIS — F419 Anxiety disorder, unspecified: Secondary | ICD-10-CM | POA: Diagnosis not present

## 2018-07-01 DIAGNOSIS — E041 Nontoxic single thyroid nodule: Secondary | ICD-10-CM

## 2018-07-01 DIAGNOSIS — E7800 Pure hypercholesterolemia, unspecified: Secondary | ICD-10-CM

## 2018-07-01 DIAGNOSIS — L2084 Intrinsic (allergic) eczema: Secondary | ICD-10-CM

## 2018-07-01 DIAGNOSIS — F32A Depression, unspecified: Secondary | ICD-10-CM

## 2018-07-01 DIAGNOSIS — F329 Major depressive disorder, single episode, unspecified: Secondary | ICD-10-CM

## 2018-07-01 LAB — POCT URINALYSIS DIP (MANUAL ENTRY)
Bilirubin, UA: NEGATIVE
GLUCOSE UA: NEGATIVE mg/dL
Ketones, POC UA: NEGATIVE mg/dL
Leukocytes, UA: NEGATIVE
Nitrite, UA: NEGATIVE
Protein Ur, POC: NEGATIVE mg/dL
Spec Grav, UA: 1.015 (ref 1.010–1.025)
Urobilinogen, UA: 0.2 E.U./dL
pH, UA: 5 (ref 5.0–8.0)

## 2018-07-01 LAB — LIPID PANEL
Chol/HDL Ratio: 6.5 ratio — ABNORMAL HIGH (ref 0.0–4.4)
Cholesterol, Total: 208 mg/dL — ABNORMAL HIGH (ref 100–199)
HDL: 32 mg/dL — AB (ref >39)
LDL Calculated: 112 mg/dL — ABNORMAL HIGH (ref 0–99)
Triglycerides: 322 mg/dL — ABNORMAL HIGH (ref 0–149)
VLDL Cholesterol Cal: 64 mg/dL — ABNORMAL HIGH (ref 5–40)

## 2018-07-01 MED ORDER — RIZATRIPTAN BENZOATE 10 MG PO TBDP: 10.0000 mg | ORAL_TABLET | ORAL | 5 refills | Status: AC | PRN

## 2018-07-01 MED ORDER — TRIAMCINOLONE 0.1 % CREAM:EUCERIN CREAM 1:1: 1.0000 "application " | TOPICAL_CREAM | Freq: Two times a day (BID) | CUTANEOUS | 2 refills | Status: DC | PRN

## 2018-07-01 MED ORDER — FLUOXETINE HCL 20 MG PO TABS: ORAL_TABLET | ORAL | 5 refills | Status: AC

## 2018-07-01 NOTE — Progress Notes (Signed)
Subjective:    Patient ID: Kendra GurneyDecolia Saunders, female    DOB: 1966-05-11, 52 y.o.   MRN: [id number]  07/01/2018  Depression (6 month follow-up ) and Anxiety    HPI This 52 y.o. female presents for six month follow-up of anxiety/depression, migraines, thyroid nodules.  Management changes made at last visit include: Controlled anxiety and depression on current treatment regimen.  Refill provided.  Patient has returned to schoolWith plans to leave the classroom in the upcoming year.    Migraines well controlled at this time.  Dyslipidemia:  I recommend weight loss, exercise, and low-cholesterol low-fat food choices.  I recommend limiting red meat to once per week; I recommend limiting fried foods to once per month.  Glucose intolerance:  I recommend weight loss, exercise, and low-carbohydrate low-sugar food choices. You should AVOID: regular sodas, sweetened tea, fruit juices.  You should LIMIT: breads, pastas, rice, potatoes, and desserts/sweets.  I would recommend limiting your total carbohydrate intake per meal to 45 grams; I would limit your total carbohydrate intake per snack to 30 grams.  I would also have a goal of 60 grams of protein intake per day; this would equal 10-15 grams of protein per meal and 5-10 grams of protein per snack. Thyromegaly: New onset.  Recent thyroid labs normal.  Refer for thyroid ultrasound. New onset left ulnar neuropathy: Versus carpal tunnel syndrome. Consistent with excessive computer work.  Recommend rest.  Wrist splint provided to wear at bedtime.  If no improvement in 4-6 weeks, contact provider for referral to orthopedics. Glucose/sugar is normal at 80; however, hemoglobin A1c remains elevated at 6.1 which is consistent with a prediabetic state.  I recommend weight loss, exercise, and low-carbohydrate low-sugar food choices. You should AVOID: regular sodas, sweetened tea, fruit juices. You should LIMIT: breads, pastas, rice, potatoes, and desserts/sweets.  I would recommend limiting your total carbohydrate intake per meal to 45 grams; I would limit your total carbohydrate intake per snack to 30 grams. I would also have a goal of 60 grams of protein intake per day; this would equal 10-15 grams of protein per meal and 5-10 grams of protein per snack. Thyroid function is normal. I still recommend undergoing thyroid ultrasound to evaluate the thyroid size.  UPDATE: .  Shows a right superior thyroid nodule measuring 1.5 cm and biopsy recommended.  A right inferior thyroid nodule measuring 1.9 cm.  Follow-up thyroid ultrasound recommended in 1 year.  Biopsy of the superior thyroid nodule was obtained on February 05, 2018.  Biopsy revealed benign follicular pathology yet scant cells were obtained.  Repeat thyroid ultrasound recommended in 6 months by provider. Patient frustrated by ongoing weight gain.  Started exercising yesterday.  Finishing up masters in education administration.  She will graduate in September 2019.  Nervous and excited about this transition.  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  Emotionally doing well other than stress of upcoming work transition.  Denies depressive or anxiety symptoms.  Sleeping well. Migraines are well controlled at this time.  No concerns. Last menstrual period in January 2019.  No longer suffering with menorrhagia. Patient is scheduled for repeat thyroid ultrasound next week.  Itching bilateral lower extremities: Patient has suffered with itching of lower legs for months.  Concerned about eczema etiology.  Recently switched soaps and detergents to hypoallergenic formulations.  Continues to use a scented lotion.  Has scars on lower legs due to scratching and rashes.  Denies redness, fever, pustules, vesicles.  Also  itches on back. BP Readings from Last 3 Encounters:  07/01/18 120/70  12/29/17 130/82  10/22/17 136/82   Wt Readings from Last 3 Encounters:  07/01/18 212 lb (96.2 kg)  12/29/17  214 lb (97.1 kg)  10/22/17 210 lb 9.6 oz (95.5 kg)   Immunization History  Administered Date(s) Administered  . Influenza Split 10/10/2015, 10/19/2016  . Influenza,inj,Quad PF,6+ Mos 09/18/2013, 10/19/2014, 10/10/2017  . Influenza-Unspecified 09/30/2017  . Tdap 12/25/2015    Review of Systems  Constitutional: Negative for activity change, appetite change, chills, diaphoresis, fatigue, fever and unexpected weight change.  HENT: Negative for congestion, dental problem, drooling, ear discharge, ear pain, facial swelling, hearing loss, mouth sores, nosebleeds, postnasal drip, rhinorrhea, sinus pressure, sneezing, sore throat, tinnitus, trouble swallowing and voice change.   Eyes: Negative for photophobia, pain, discharge, redness, itching and visual disturbance.  Respiratory: Negative for apnea, cough, choking, chest tightness, shortness of breath, wheezing and stridor.   Cardiovascular: Negative for chest pain, palpitations and leg swelling.  Gastrointestinal: Negative for abdominal distention, abdominal pain, anal bleeding, blood in stool, constipation, diarrhea, nausea, rectal pain and vomiting.  Endocrine: Negative for cold intolerance, heat intolerance, polydipsia, polyphagia and polyuria.  Genitourinary: Negative for decreased urine volume, difficulty urinating, dyspareunia, dysuria, enuresis, flank pain, frequency, genital sores, hematuria, menstrual problem, pelvic pain, urgency, vaginal bleeding, vaginal discharge and vaginal pain.  Musculoskeletal: Negative for arthralgias, back pain, gait problem, joint swelling, myalgias, neck pain and neck stiffness.  Skin: Negative for color change, pallor, rash and wound.  Allergic/Immunologic: Negative for environmental allergies, food allergies and immunocompromised state.  Neurological: Negative for dizziness, tremors, seizures, syncope, facial asymmetry, speech difficulty, weakness, light-headedness, numbness and headaches.  Hematological:  Negative for adenopathy. Does not bruise/bleed easily.  Psychiatric/Behavioral: Negative for agitation, behavioral problems, confusion, decreased concentration, dysphoric mood, hallucinations, self-injury, sleep disturbance and suicidal ideas. The patient is not nervous/anxious and is not hyperactive.     Past Medical History:  Diagnosis Date  . Anxiety   . Asthma    childhood; last Albuterol use elementary school.  . Depression   . Migraine   . Seasonal allergies   . Sickle cell trait [facility])    Past Surgical History:  Procedure Laterality Date  . EYE SURGERY     R orbital fracture  . HAMMER TOE SURGERY    . orbital right fracture  1995  . OVARIAN CYST REMOVAL    . TONSILLECTOMY    . TUBAL LIGATION     Allergies  Allergen Reactions  . Peanuts [Peanut Oil]     Throat itches and she has stomach pains   No current outpatient medications on file prior to visit.   Current Facility-Administered Medications on File Prior to Visit  Medication Dose Route Frequency Provider Last Rate Last Dose  . 0.9 %  sodium chloride infusion  500 mL Intravenous Continuous Nandigam, Eleonore Chiquito, MD       Social History   Socioeconomic History  . Marital status: Single    Spouse name: n/a  . Number of children: 2  . Years of education: Master's   . Highest education level: Not on file  Occupational History  . Occupation: Magazine features editor: [employer]  Social Needs  . Financial resource strain: Not on file  . Food insecurity:    Worry: Not on file    Inability: Not on file  . Transportation needs:    Medical: Not on file    Non-medical: Not on file  Tobacco Use  . Smoking status: Never Smoker  . Smokeless tobacco: Never Used  Substance and Sexual Activity  . Alcohol use: Yes    Comment: rarely  . Drug use: No  . Sexual activity: Yes    Partners: Male  Lifestyle  . Physical activity:    Days per week: Not on file    Minutes per session: Not on file  . Stress: Not on  file  Relationships  . Social connections:    Talks on phone: Not on file    Gets together: Not on file    Attends religious service: Not on file    Active member of club or organization: Not on file    Attends meetings of clubs or organizations: Not on file    Relationship status: Not on file  . Intimate partner violence:    Fear of current or ex partner: Not on file    Emotionally abused: Not on file    Physically abused: Not on file    Forced sexual activity: Not on file  Other Topics Concern  . Not on file  Social History Narrative   Marital status: divorced since 2002; +dating seriously x 7 years.      Children:  2 children (28, 20); no grandchildren.      Lives:  with her two children.      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      Tobacco: none       Alcohol: wine once per week.       Exercise:  Walking 3 days per week      Seatbelt: 100%      Guns: none      Sexual activity:  Total sexual partners = 20.  No STDs.  Last STD testing never.   Date males.     Family History  Problem Relation Age of Onset  . Cancer Mother 75       breast cancer  . Cancer Father 72       brain cancer; prostate cancer  . Heart disease Brother 39       heart attack  . Cancer Paternal Grandfather        breast cancer  . Lupus Daughter   . Cancer Sister        breast cancer  . Cancer Sister 86       breast cancer  . Colon cancer Neg Hx        Objective:    BP 120/70   Pulse (!) 102   Temp 98.8 F (37.1 C) (Oral)   Resp 16   Ht 5' 10.08" (1.78 m)   Wt 212 lb (96.2 kg)   SpO2 98%   BMI 30.35 kg/m  Physical Exam  Constitutional: She is oriented to person, place, and time. She appears well-developed and well-nourished. No distress.  HENT:  Head: Normocephalic and atraumatic.  Right Ear: External ear normal.  Left Ear: External ear normal.  Nose: Nose normal.  Mouth/Throat: Oropharynx is clear and moist.  Eyes: Pupils are equal,  round, and reactive to light. Conjunctivae and EOM are normal.  Neck: Normal range of motion and full passive range of motion without pain. Neck supple. No JVD present. Carotid bruit is not present. Thyromegaly present.  Cardiovascular: Normal rate, regular rhythm and normal heart sounds. Exam reveals no gallop and no friction rub.  No murmur heard. Pulmonary/Chest: Effort normal and breath sounds normal. She has no wheezes.  She has no rales.  Abdominal: Soft. Bowel sounds are normal. She exhibits no distension and no mass. There is no tenderness. There is no rebound and no guarding.  Musculoskeletal:       Right shoulder: Normal.       Left shoulder: Normal.       Cervical back: Normal.  Lymphadenopathy:    She has no cervical adenopathy.  Neurological: She is alert and oriented to person, place, and time. She has normal reflexes. No cranial nerve deficit. She exhibits normal muscle tone. Coordination normal.  Skin: Skin is warm and dry. No rash noted. She is not diaphoretic. No erythema. No pallor.  Bilateral lower extremity skin with scars suggestive of healing insect bites.  Mild excoriations present on bilateral ankles.  Psychiatric: She has a normal mood and affect. Her behavior is normal. Judgment and thought content normal.  Nursing note and vitals reviewed.  No results found. Depression screen [facility] 2/9 07/01/2018 12/29/2017 12/29/2017 10/10/2017 06/25/2017  Decreased Interest 0 0 0 0 1  Down, Depressed, Hopeless 0 0 0 0 1  PHQ - 2 Score 0 0 0 0 2  Altered sleeping - - - - 0  Tired, decreased energy - - - - 0  Change in appetite - - - - 0  Feeling bad or failure about yourself  - - - - 0  Trouble concentrating - - - - 0  Moving slowly or fidgety/restless - - - - 0  Suicidal thoughts - - - - 0  PHQ-9 Score - - - - 2  Difficult doing work/chores - - - - -   Fall Risk  07/01/2018 12/29/2017 12/29/2017 10/10/2017 06/25/2017  Falls in the past year? No No No No No        Assessment &  Plan:   1. Anxiety and depression   2. Migraine without aura and without status migrainosus, not intractable   3. Glucose intolerance   4. Pure hypercholesterolemia   5. Intrinsic eczema    Anxiety and depression and migraines:  Well-controlled.  No changes in therapy.  Thyroid nodules: s/p bx of one nodule yet scant cellularity with benign pathology; scheduled for repeat thyroid US next week.  May warrant repeat bx if nodule has grown.  Glucose intolerance and hypercholesterolemia: uncontrolled.   I recommend weight loss, exercise, and low-carbohydrate low-sugar food choices. You should AVOID: regular sodas, sweetened tea, fruit juices.  You should LIMIT: breads, pastas, rice, potatoes, and desserts/sweets.  I would recommend limiting your total carbohydrate intake per meal to 45 grams; I would limit your total carbohydrate intake per snack to 30 grams.  I would also have a goal of 60 grams of protein intake per day; this would equal 10-15 grams of protein per meal and 5-10 grams of protein per snack.   I recommend weight loss, exercise, and low-cholesterol low-fat food choices.  I recommend limiting red meat to once per week; I recommend limiting fried foods to once per month.  Eczema versus contact dermatitis: New.  Recommend switching to a hypoallergenic lotion such as Eucerin or Aveeno.  Rx for Triamcinolone-eucerin mixture to apply twice daily.  Orders Placed This Encounter  Procedures  . CBC with Differential/Platelet  . Comprehensive metabolic panel    Order Specific Question:   Has the patient fasted?    Answer:   No  . Hemoglobin A1c  . Lipid panel  . POCT urinalysis dipstick   Meds ordered this encounter  Medications  . FLUoxetine (  PROZAC) 20 MG tablet    Sig: TAKE 2 TABLETS(40 MG) BY MOUTH DAILY    Dispense:  60 tablet    Refill:  5  . rizatriptan (MAXALT-MLT) 10 MG disintegrating tablet    Sig: Take 1 tablet (10 mg total) by mouth as needed for migraine. May repeat in 2  hours if needed    Dispense:  8 tablet    Refill:  5  . Triamcinolone Acetonide (TRIAMCINOLONE 0.1 % CREAM : EUCERIN) CREA    Sig: Apply 1 application topically 2 (two) times daily as needed.    Dispense:  1 each    Refill:  2    1:1 ratio; dispense: 1 jar    No follow-ups on file.   Ladawn Boullion Paulita Fujita, M.D. Primary Care at [facility] previously [facility] [address] [place], [place]  [address] [phone] phone [phone] fax

## 2018-07-01 NOTE — Patient Instructions (Addendum)
   IF you received an x-ray today, you will receive an invoice from [facility]. Please contact  Radiology at [phone] with questions or concerns regarding your invoice.   IF you received labwork today, you will receive an invoice from [facility]. Please contact [facility] at [phone] with questions or concerns regarding your invoice.   Our billing staff will not be able to assist you with questions regarding bills from these companies.  You will be contacted with the lab results as soon as they are available. The fastest way to get your results is to activate your My Chart account. Instructions are located on the last page of this paperwork. If you have not heard from us regarding the results in 2 weeks, please contact this office.      Atopic Dermatitis Atopic dermatitis is a skin disorder that causes inflammation of the skin. This is the most common type of eczema. Eczema is a group of skin conditions that cause the skin to be itchy, red, and swollen. This condition is generally worse during the cooler winter months and often improves during the warm summer months. Symptoms can vary from person to person. Atopic dermatitis usually starts showing signs in infancy and can last through adulthood. This condition cannot be passed from one person to another (non-contagious), but it is more common in families. Atopic dermatitis may not always be present. When it is present, it is called a flare-up. What are the causes? The exact cause of this condition is not known. Flare-ups of the condition may be triggered by:  Contact with something that you are sensitive or allergic to.  Stress.  Certain foods.  Extremely hot or cold weather.  Harsh chemicals and soaps.  Dry air.  Chlorine.  What increases the risk? This condition is more likely to develop in people who have a personal history or family history of eczema, allergies, asthma, or hay fever. What are the  signs or symptoms? Symptoms of this condition include:  Dry, scaly skin.  Red, itchy rash.  Itchiness, which can be severe. This may occur before the skin rash. This can make sleeping difficult.  Skin thickening and cracking that can occur over time.  How is this diagnosed? This condition is diagnosed based on your symptoms, a medical history, and a physical exam. How is this treated? There is no cure for this condition, but symptoms can usually be controlled. Treatment focuses on:  Controlling the itchiness and scratching. You may be given medicines, such as antihistamines or steroid creams.  Limiting exposure to things that you are sensitive or allergic to (allergens).  Recognizing situations that cause stress and developing a plan to manage stress.  If your atopic dermatitis does not get better with medicines, or if it is all over your body (widespread), a treatment using a specific type of light (phototherapy) may be used. Follow these instructions at home: Skin care  Keep your skin well-moisturized. Doing this seals in moisture and helps to prevent dryness. ? Use unscented lotions that have petroleum in them. ? Avoid lotions that contain alcohol or water. They can dry the skin.  Keep baths or showers short (less than 5 minutes) in warm water. Do not use hot water. ? Use mild, unscented cleansers for bathing. Avoid soap and bubble bath. ? Apply a moisturizer to your skin right after a bath or shower.  Do not apply anything to your skin without checking with your health care provider. General instructions  Dress in clothes   made of cotton or cotton blends. Dress lightly because heat increases itchiness.  When washing your clothes, rinse your clothes twice so all of the soap is removed.  Avoid any triggers that can cause a flare-up.  Try to manage your stress.  Keep your fingernails cut short.  Avoid scratching. Scratching makes the rash and itchiness worse. It may  also result in a skin infection (impetigo) due to a break in the skin caused by scratching.  Take or apply over-the-counter and prescription medicines only as told by your health care provider.  Keep all follow-up visits as told by your health care provider. This is important.  Do not be around people who have cold sores or fever blisters. If you get the infection, it may cause your atopic dermatitis to worsen. Contact a health care provider if:  Your itchiness interferes with sleep.  Your rash gets worse or it is not better within one week of starting treatment.  You have a fever.  You have a rash flare-up after having contact with someone who has cold sores or fever blisters. Get help right away if:  You develop pus or soft yellow scabs in the rash area. Summary  This condition causes a red rash and itchy, dry, scaly skin.  Treatment focuses on controlling the itchiness and scratching, limiting exposure to things that you are sensitive or allergic to (allergens), recognizing situations that cause stress, and developing a plan to manage stress.  Keep your skin well-moisturized.  Keep baths or showers shorter than 5 minutes and use warm water. Do not use hot water. This information is not intended to replace advice given to you by your health care provider. Make sure you discuss any questions you have with your health care provider. Document Released: 11/22/2000 Document Revised: 12/27/2016 Document Reviewed: 12/27/2016 Elsevier Interactive Patient Education  2018 [employer] Inc[employer]  

## 2018-07-02 DIAGNOSIS — E041 Nontoxic single thyroid nodule: Secondary | ICD-10-CM | POA: Insufficient documentation

## 2018-07-02 LAB — CBC WITH DIFFERENTIAL/PLATELET
BASOS ABS: 0 10*3/uL (ref 0.0–0.2)
Basos: 0 %
EOS (ABSOLUTE): 0.1 10*3/uL (ref 0.0–0.4)
Eos: 2 %
Hematocrit: 41.1 % (ref 34.0–46.6)
Hemoglobin: 13.5 g/dL (ref 11.1–15.9)
IMMATURE GRANS (ABS): 0 10*3/uL (ref 0.0–0.1)
Immature Granulocytes: 0 %
LYMPHS ABS: 1.8 10*3/uL (ref 0.7–3.1)
LYMPHS: 31 %
MCH: 27.5 pg (ref 26.6–33.0)
MCHC: 32.8 g/dL (ref 31.5–35.7)
MCV: 84 fL (ref 79–97)
MONOS ABS: 0.5 10*3/uL (ref 0.1–0.9)
Monocytes: 8 %
Neutrophils Absolute: 3.4 10*3/uL (ref 1.4–7.0)
Neutrophils: 59 %
PLATELETS: 281 10*3/uL (ref 150–450)
RBC: 4.91 x10E6/uL (ref 3.77–5.28)
RDW: 14.8 % (ref 12.3–15.4)
WBC: 5.8 10*3/uL (ref 3.4–10.8)

## 2018-07-02 LAB — COMPREHENSIVE METABOLIC PANEL
ALT: 26 IU/L (ref 0–32)
AST: 24 IU/L (ref 0–40)
Albumin/Globulin Ratio: 1.7 (ref 1.2–2.2)
Albumin: 5 g/dL (ref 3.5–5.5)
Alkaline Phosphatase: 56 IU/L (ref 39–117)
BUN/Creatinine Ratio: 20 (ref 9–23)
BUN: 17 mg/dL (ref 6–24)
Bilirubin Total: 0.4 mg/dL (ref 0.0–1.2)
CALCIUM: 10.1 mg/dL (ref 8.7–10.2)
CO2: 23 mmol/L (ref 20–29)
CREATININE: 0.83 mg/dL (ref 0.57–1.00)
Chloride: 100 mmol/L (ref 96–106)
GFR, EST AFRICAN AMERICAN: 93 mL/min/{1.73_m2} (ref >59)
GFR, EST NON AFRICAN AMERICAN: 81 mL/min/{1.73_m2} (ref >59)
Globulin, Total: 3 g/dL (ref 1.5–4.5)
Glucose: 112 mg/dL — ABNORMAL HIGH (ref 65–99)
Potassium: 4.7 mmol/L (ref 3.5–5.2)
Sodium: 140 mmol/L (ref 134–144)
Total Protein: 8 g/dL (ref 6.0–8.5)

## 2018-07-02 LAB — HEMOGLOBIN A1C
Est. average glucose Bld gHb Est-mCnc: 128 mg/dL
Hgb A1c MFr Bld: 6.1 % — ABNORMAL HIGH (ref 4.8–5.6)

## 2018-07-06 ENCOUNTER — Ambulatory Visit
Admission: RE | Admit: 2018-07-06 | Discharge: 2018-07-06 | Disposition: A | Discharge status: Home / Self Care | Payer: BC Managed Care – PPO | Source: Ambulatory Visit | Attending: Family Medicine | Admitting: Family Medicine

## 2018-07-06 DIAGNOSIS — E041 Nontoxic single thyroid nodule: Secondary | ICD-10-CM

## 2018-07-30 ENCOUNTER — Encounter: Payer: Self-pay | Admitting: Family Medicine

## 2018-07-30 DIAGNOSIS — R7303 Prediabetes: Secondary | ICD-10-CM | POA: Insufficient documentation

## 2018-09-26 IMAGING — US US THYROID
1 series · 13 of 25 positions shown · non-contrast
Comparison: None.

CLINICAL DATA: THYROMEGALY ON EXAM

EXAM:
THYROID ULTRASOUND
TECHNIQUE: Ultrasound examination of the thyroid gland and adjacent soft
tissues was performed.

[Series 1: us thyroid · 0.06mm/px · 13 of 54 slices shown]
[im 1/54]
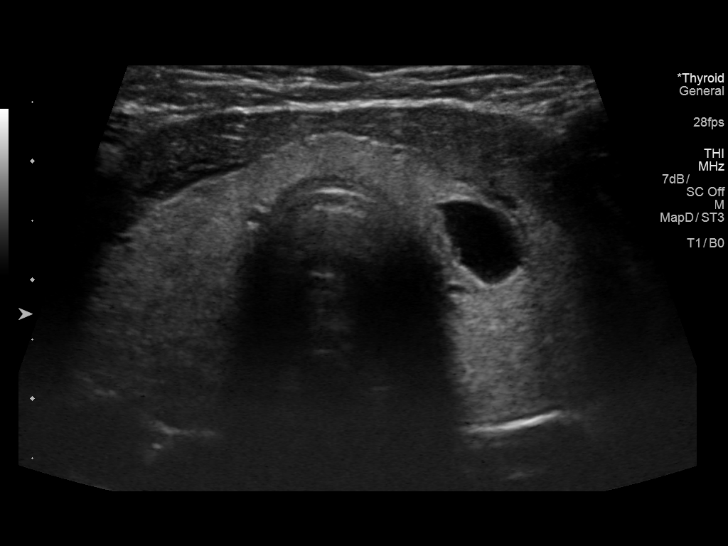
[im 5/54]
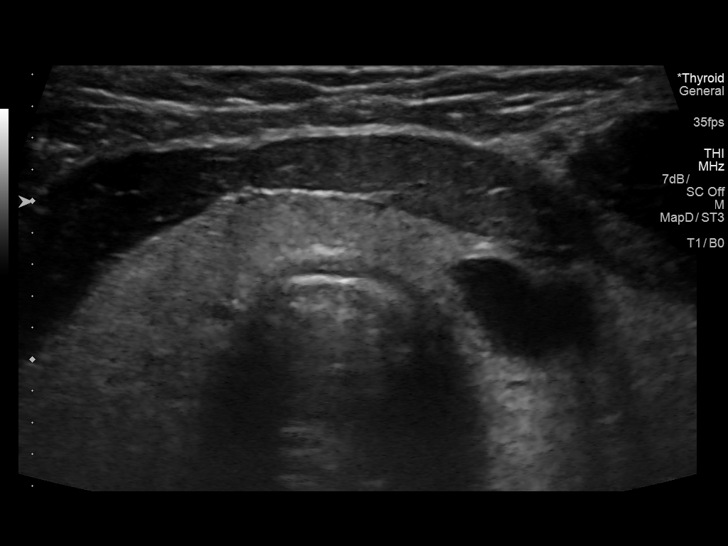
[im 9/54]
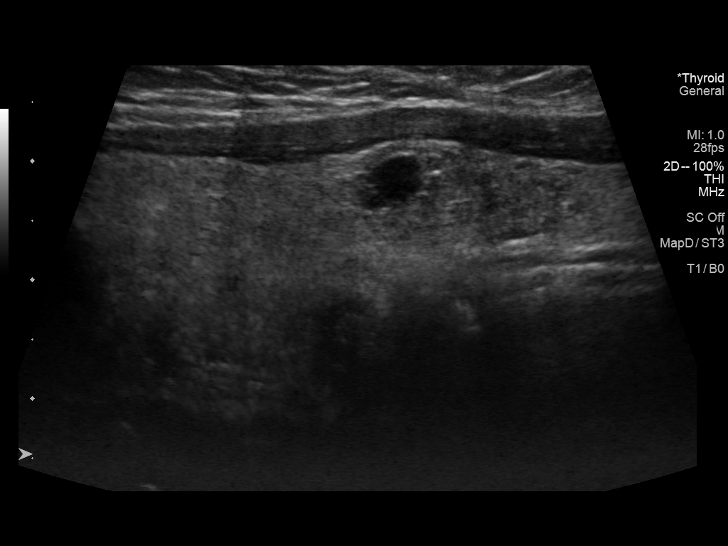
[im 14/54]
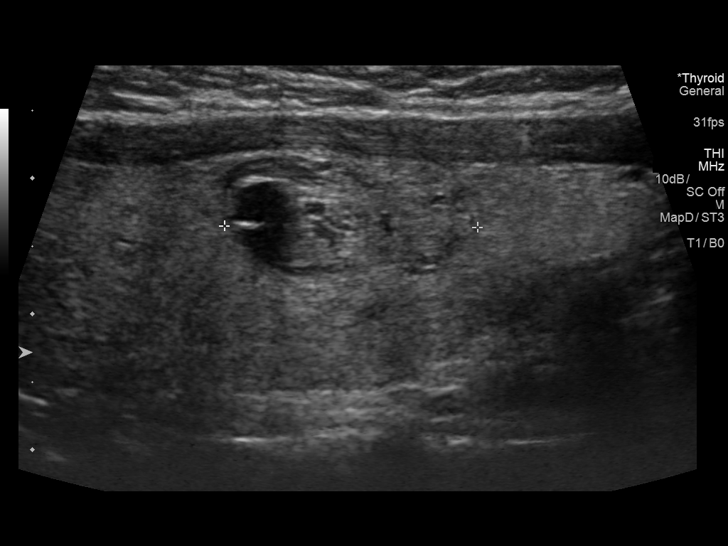
[im 18/54]
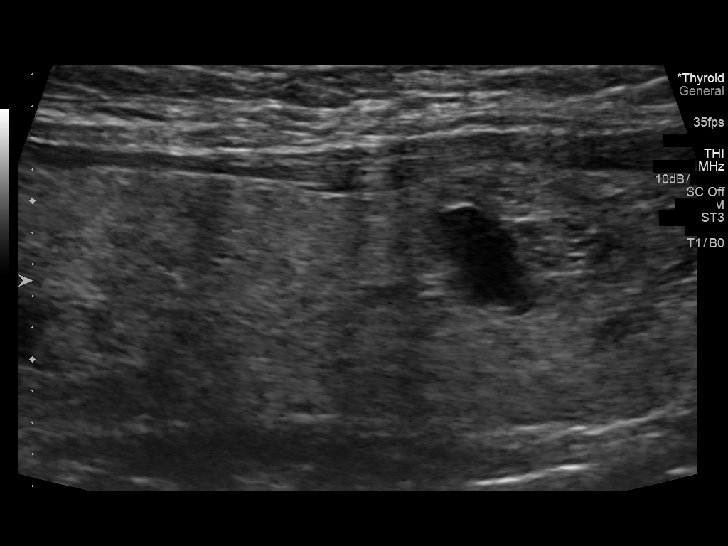
[im 23/54]
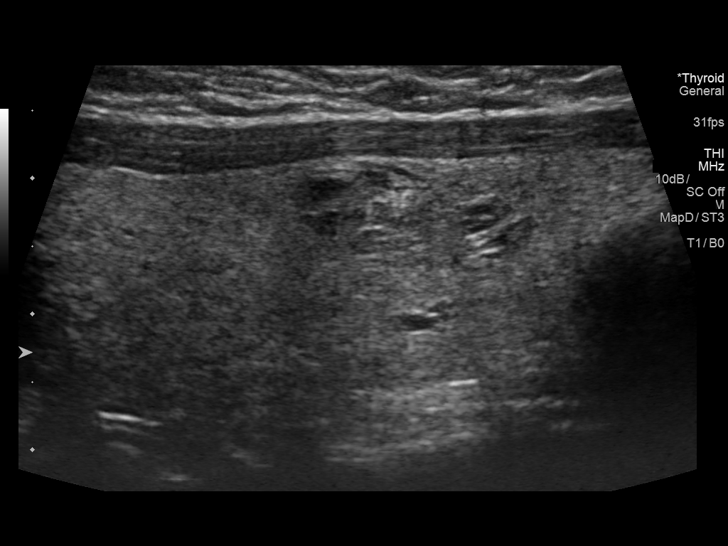
[im 27/54]
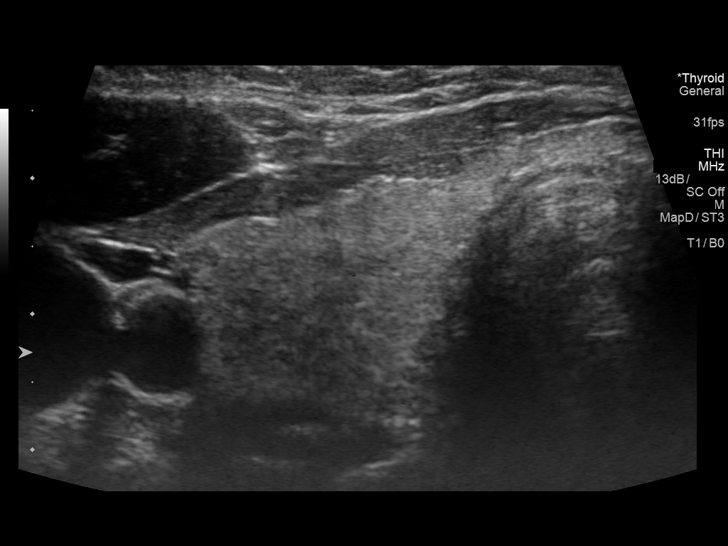
[im 31/54]
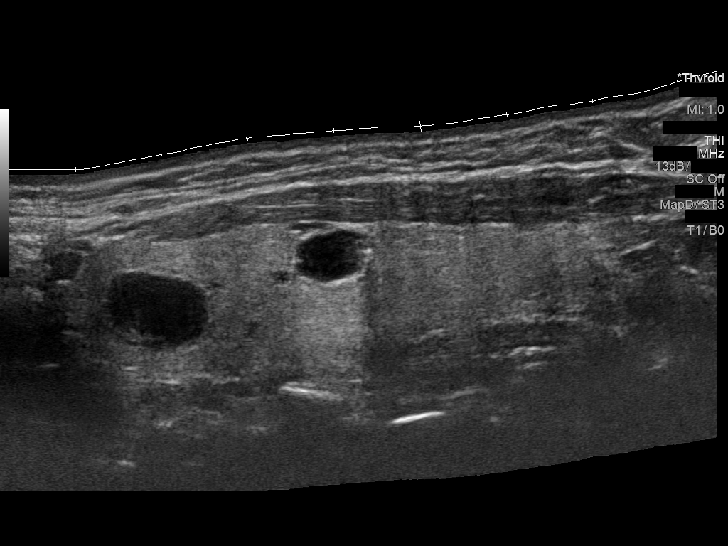
[im 36/54]
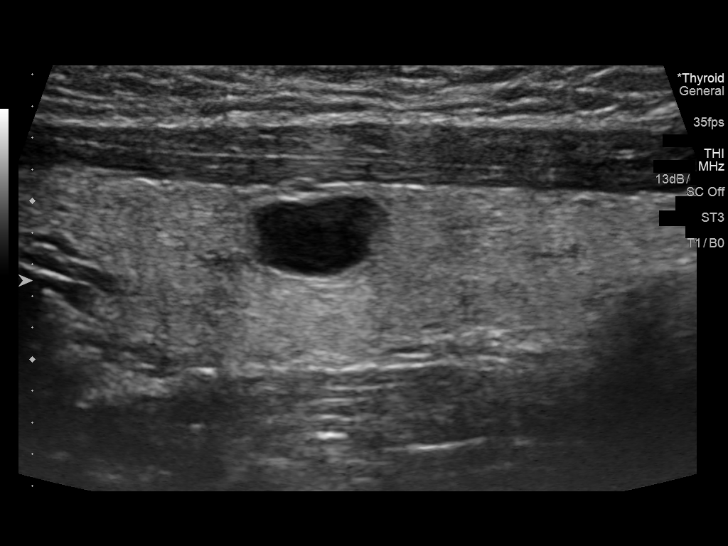
[im 40/54]
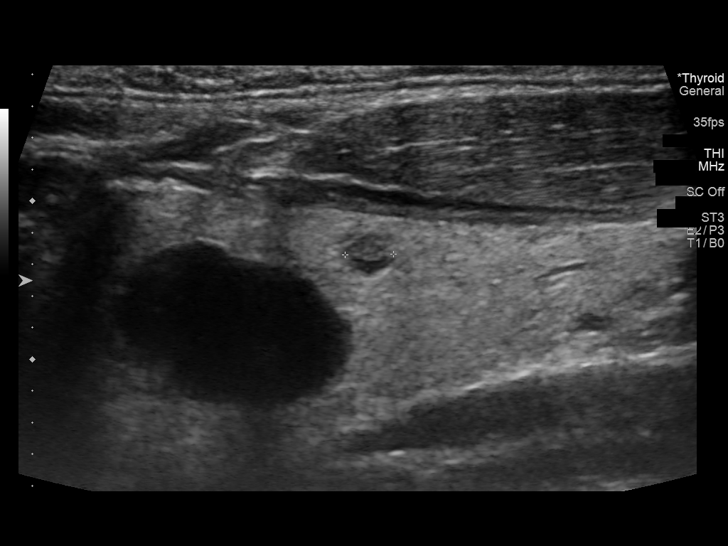
[im 45/54]
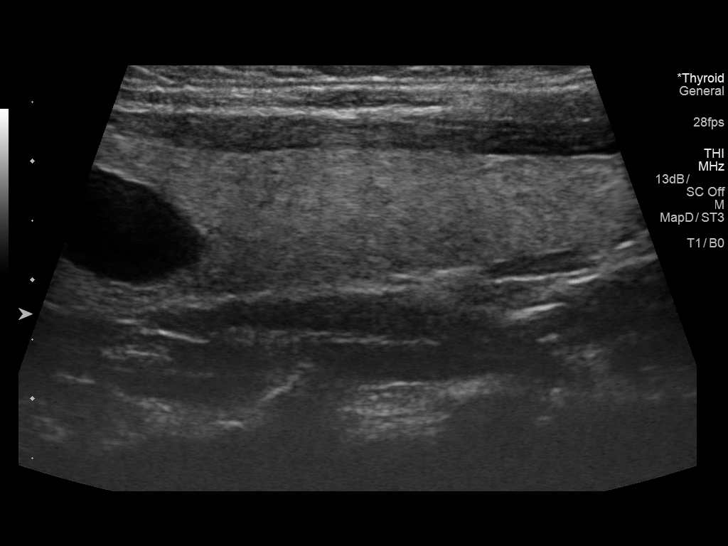
[im 49/54]
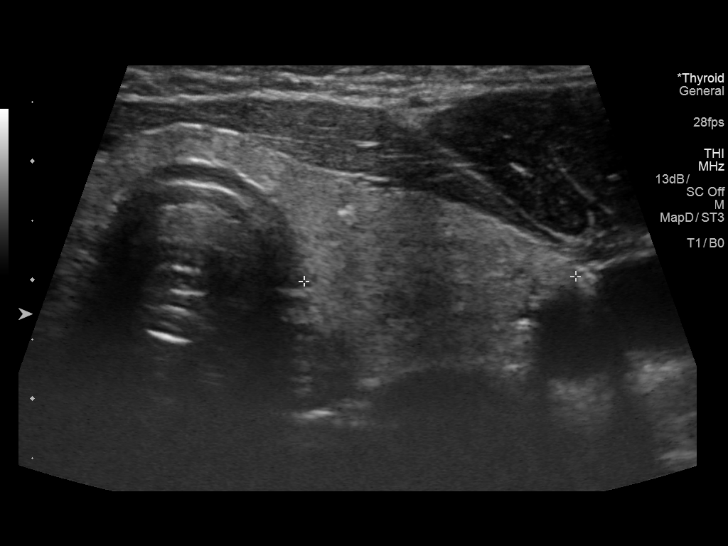
[im 54/54]
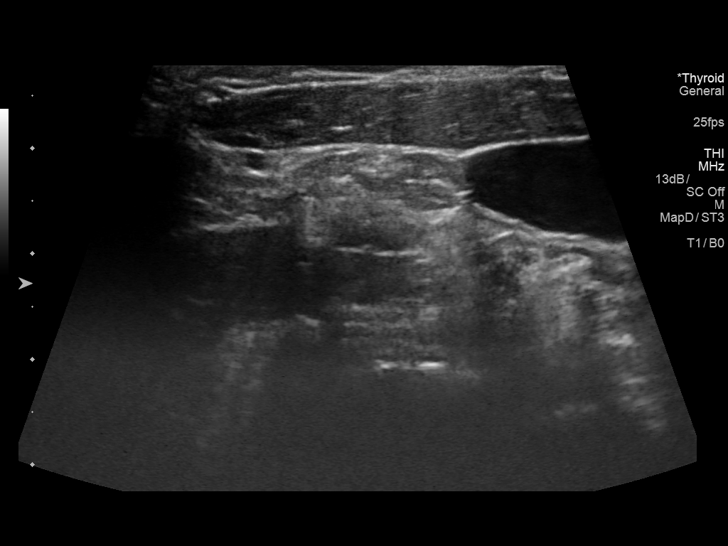

[13 of 25 positions shown; findings below may reference images not displayed]

FINDINGS: Parenchymal Echotexture: Mildly heterogenous

Isthmus: 5 mm

Right lobe: 6.8 x 2.1 x 1.9 cm

Left lobe: 7.0 x 1.8 x 2.3 cm

_________________________________________________________

Estimated total number of nodules >/= 1 cm: 3

Number of spongiform nodules >/=  2 cm not described below (TR1): 0

Number of mixed cystic and solid nodules >/= 1.5 cm not described
below (TR2): 1

_________________________________________________________

Nodule # 1:

Location: Right; Superior

Maximum size: 1.5 cm; Other 2 dimensions: 0.9 x 1.3 cm

Composition: solid/almost completely solid (2)

Echogenicity: isoechoic (1)

Shape: not taller-than-wide (0)

Margins: ill-defined (0)

Echogenic foci: punctate echogenic foci (3)

ACR TI-RADS total points: 6.

ACR TI-RADS risk category: TR4 (4-6 points).

ACR TI-RADS recommendations:

**Given size (>/= 1.5 cm) and appearance, fine needle aspiration of
this moderately suspicious nodule should be considered based on
TI-RADS criteria.

_________________________________________________________

Nodule # 2:

Location: Right; Inferior

Maximum size: 1.9 cm; Other 2 dimensions: 0.9 x 1.1 cm

Composition: solid/almost completely solid (2)

Echogenicity: isoechoic (1)

Shape: not taller-than-wide (0)

Margins: ill-defined (0)

Echogenic foci: none (0)

ACR TI-RADS total points: 3.

ACR TI-RADS risk category: TR3 (3 points).

ACR TI-RADS recommendations:

*Given size (>/= 1.5 - 2.4 cm) and appearance, a follow-up
ultrasound in 1 year should be considered based on TI-RADS criteria.

_________________________________________________________

Additional cystic nodules in the left lobe noted which are not fully
described by TI rads criteria. These would not meet criteria for any
biopsy or follow-up.

No adenopathy.
IMPRESSION: 1.5 cm right superior TR 4 nodule meets criteria for biopsy as
above.

1.9 cm right inferior TR 3 nodule meets criteria for follow-up in 1
year.

The above is in keeping with the ACR TI-RADS recommendations - [HOSPITAL] 9725;[DATE].

## 2018-11-30 ENCOUNTER — Other Ambulatory Visit: Payer: Self-pay

## 2018-11-30 ENCOUNTER — Ambulatory Visit: Payer: BC Managed Care – PPO | Admitting: Family Medicine

## 2018-11-30 ENCOUNTER — Encounter: Payer: Self-pay | Admitting: Family Medicine

## 2018-11-30 VITALS — BP 129/84 | HR 74 | Temp 98.4°F | Resp 16 | Ht 69.25 in | Wt 214.2 lb

## 2018-11-30 DIAGNOSIS — L728 Other follicular cysts of the skin and subcutaneous tissue: Secondary | ICD-10-CM

## 2018-11-30 DIAGNOSIS — L729 Follicular cyst of the skin and subcutaneous tissue, unspecified: Secondary | ICD-10-CM

## 2018-11-30 DIAGNOSIS — Z23 Encounter for immunization: Secondary | ICD-10-CM | POA: Diagnosis not present

## 2018-11-30 DIAGNOSIS — T3695XA Adverse effect of unspecified systemic antibiotic, initial encounter: Secondary | ICD-10-CM | POA: Diagnosis not present

## 2018-11-30 MED ORDER — DOXYCYCLINE HYCLATE 100 MG PO CAPS: 100.0000 mg | ORAL_CAPSULE | Freq: Two times a day (BID) | ORAL | 0 refills | Status: DC

## 2018-11-30 MED ORDER — FLUCONAZOLE 150 MG PO TABS: ORAL_TABLET | ORAL | 0 refills | Status: DC

## 2018-11-30 NOTE — Progress Notes (Signed)
Patient ID: Kendra GurneyDecolia Saunders, female    DOB: 02/04/1966  Age: 52 y.o. MRN: [id number]  Chief Complaint  Patient presents with  . Mass    LEFT shoulder x 1 week - per patient getting more painful    Subjective:   The last week or so the patient has had a knot come up on her left shoulder which is gradually gotten bigger and more painful and more tender.  It is just about at the spot that the bra strap crosses the shoulder for her.  She is not allergic to any medications.  Current allergies, medications, problem list, past/family and social histories reviewed.  Objective:  BP 129/84   Pulse 74   Temp 98.4 F (36.9 C) (Oral)   Resp 16   Ht 5' 9.25" (1.759 m)   Wt 214 lb 3.2 oz (97.2 kg)   LMP 05/08/2017   SpO2 97%   BMI 31.40 kg/m   Pleasant lady, no acute distress.  She has a 3 cm or so area of nodularity with surrounding induration and early erythema on the top of left shoulder about halfway between the joint and the neck.  Procedure note: 2% lidocaine was used to locally anesthetize the area.  Wound was prepped in the routine fashion.  The cyst was I&D needed, and a large core of thick gray-brown material was removed.  The capsule was then dissected free.  It was removed pretty much intact.  It was very scarred in, but I think I got it all.   Assessment & Plan:   Assessment: 1. Cutaneous cyst   2. Need for prophylactic vaccination and inoculation against influenza   3. Adverse effect of antibiotic       Plan: Return the day after Christmas to get the packing change.  Orders Placed This Encounter  Procedures  . Flu Vaccine QUAD 36+ mos IM    Meds ordered this encounter  Medications  . doxycycline (VIBRAMYCIN) 100 MG capsule    Sig: Take 1 capsule (100 mg total) by mouth 2 (two) times daily.    Dispense:  20 capsule    Refill:  0  . fluconazole (DIFLUCAN) 150 MG tablet    Sig: Take 1 pill if needed for yeast.  After 3 days repeat if needed.    Dispense:  2  tablet    Refill:  0         Patient Instructions   Keep wound clean and dry  Return in 3 days to have packing changed.  Take doxycycline 100 mg 1 twice daily  Hold the Diflucan prescription in case you get yeast.  Return sooner if problems.  If necessary go to the emergency room on the holiday.    If you have lab work done today you will be contacted with your lab results within the next 2 weeks.  If you have not heard from us then please contact us. The fastest way to get your results is to register for My Chart.   IF you received an x-ray today, you will receive an invoice from [facility]. Please contact [facility] at [phone] with questions or concerns regarding your invoice.   IF you received labwork today, you will receive an invoice from [facility]. Please contact [facility] at [phone] with questions or concerns regarding your invoice.   Our billing staff will not be able to assist you with questions regarding bills from these companies.  You will be contacted with the lab results as soon as  they are available. The fastest way to get your results is to activate your My Chart account. Instructions are located on the last page of this paperwork. If you have not heard from us regarding the results in 2 weeks, please contact this office.        Return in about 3 days (around 12/03/2018), or [facility], for Packing change of I&D.   Janace Hoardavid Sherre Wooton, MD 11/30/2018

## 2018-11-30 NOTE — Patient Instructions (Addendum)
Keep wound clean and dry  Return in 3 days to have packing changed.  Take doxycycline 100 mg 1 twice daily  Hold the Diflucan prescription in case you get yeast.  Return sooner if problems.  If necessary go to the emergency room on the holiday.    If you have lab work done today you will be contacted with your lab results within the next 2 weeks.  If you have not heard from us then please contact us. The fastest way to get your results is to register for My Chart.   IF you received an x-ray today, you will receive an invoice from [facility]. Please contact [facility] at [phone] with questions or concerns regarding your invoice.   IF you received labwork today, you will receive an invoice from [facility]. Please contact [facility] at [phone] with questions or concerns regarding your invoice.   Our billing staff will not be able to assist you with questions regarding bills from these companies.  You will be contacted with the lab results as soon as they are available. The fastest way to get your results is to activate your My Chart account. Instructions are located on the last page of this paperwork. If you have not heard from us regarding the results in 2 weeks, please contact this office.

## 2018-12-01 ENCOUNTER — Ambulatory Visit: Payer: BC Managed Care – PPO | Admitting: Family Medicine

## 2018-12-03 ENCOUNTER — Ambulatory Visit (INDEPENDENT_AMBULATORY_CARE_PROVIDER_SITE_OTHER): Payer: BC Managed Care – PPO | Admitting: Family Medicine

## 2018-12-03 ENCOUNTER — Encounter: Payer: Self-pay | Admitting: Family Medicine

## 2018-12-03 ENCOUNTER — Other Ambulatory Visit: Payer: Self-pay

## 2018-12-03 VITALS — BP 130/85 | HR 80 | Temp 98.9°F | Resp 16 | Ht 69.25 in | Wt 213.6 lb

## 2018-12-03 DIAGNOSIS — Z5189 Encounter for other specified aftercare: Secondary | ICD-10-CM

## 2018-12-03 DIAGNOSIS — Z820 Family history of epilepsy and other diseases of the nervous system: Secondary | ICD-10-CM | POA: Diagnosis not present

## 2018-12-03 NOTE — Progress Notes (Signed)
Established Patient Office Visit  Subjective:  Patient ID: Kendra Saunders, female    DOB: Jun 12, 1966  Age: 52 y.o. MRN: [id number]  CC:  Chief Complaint  Patient presents with  . packing change    HPI Kendra Saunders presents for wound packing change.   Family history of Breast cancer and Alzheimers disease Patient reports that her mother's side of the family  She states that she had BRCA testing She states that the women on her mother's side either died younger from breast cancer or if they lived long enough would develop alzheimer's disease in their 27s. She reports that she would like to know if there is a test she can do or a screening process to detect this.    Past Medical History:  Diagnosis Date  . Anxiety   . Asthma    childhood; last Albuterol use elementary school.  . Depression   . Migraine   . Seasonal allergies   . Sickle cell trait [facility])     Past Surgical History:  Procedure Laterality Date  . EYE SURGERY     R orbital fracture  . HAMMER TOE SURGERY    . orbital right fracture  1995  . OVARIAN CYST REMOVAL    . TONSILLECTOMY    . TUBAL LIGATION      Family History  Problem Relation Age of Onset  . Cancer Mother 23       breast cancer  . Cancer Father 41       brain cancer; prostate cancer  . Heart disease Brother 6       heart attack  . Cancer Paternal Grandfather        breast cancer  . Lupus Daughter   . Cancer Sister        breast cancer  . Cancer Sister 64       breast cancer  . Alzheimer's disease Maternal Grandmother 64  . Alzheimer's disease Maternal Aunt 70  . Alzheimer's disease Maternal Aunt 70  . Alzheimer's disease Maternal Aunt 70  . Colon cancer Neg Hx     Social History   Socioeconomic History  . Marital status: Single    Spouse name: n/a  . Number of children: 2  . Years of education: Master's   . Highest education level: Not on file  Occupational History  . Occupation: Product manager: [employer]  Social Needs  . Financial resource strain: Not on file  . Food insecurity:    Worry: Not on file    Inability: Not on file  . Transportation needs:    Medical: Not on file    Non-medical: Not on file  Tobacco Use  . Smoking status: Never Smoker  . Smokeless tobacco: Never Used  Substance and Sexual Activity  . Alcohol use: Yes    Comment: rarely  . Drug use: No  . Sexual activity: Yes    Partners: Male  Lifestyle  . Physical activity:    Days per week: Not on file    Minutes per session: Not on file  . Stress: Not on file  Relationships  . Social connections:    Talks on phone: Not on file    Gets together: Not on file    Attends religious service: Not on file    Active member of club or organization: Not on file    Attends meetings of clubs or organizations: Not on file    Relationship status: Not on file  .  Intimate partner violence:    Fear of current or ex partner: Not on file    Emotionally abused: Not on file    Physically abused: Not on file    Forced sexual activity: Not on file  Other Topics Concern  . Not on file  Social History Narrative   Marital status: divorced since 2002; +dating seriously x 7 years.      Children:  2 children (28, 20); no grandchildren.      Lives:  with her two children.      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      Tobacco: none       Alcohol: wine once per week.       Exercise:  Walking 3 days per week      Seatbelt: 100%      Guns: none      Sexual activity:  Total sexual partners = 20.  No STDs.  Last STD testing never.   Date males.      Outpatient Medications Prior to Visit  Medication Sig Dispense Refill  . doxycycline (VIBRAMYCIN) 100 MG capsule Take 1 capsule (100 mg total) by mouth 2 (two) times daily. 20 capsule 0  . fluconazole (DIFLUCAN) 150 MG tablet Take 1 pill if needed for yeast.  After 3 days repeat if needed. 2 tablet 0  . FLUoxetine (PROZAC) 20 MG tablet  TAKE 2 TABLETS(40 MG) BY MOUTH DAILY 60 tablet 5  . rizatriptan (MAXALT-MLT) 10 MG disintegrating tablet Take 1 tablet (10 mg total) by mouth as needed for migraine. May repeat in 2 hours if needed 8 tablet 5  . Triamcinolone Acetonide (TRIAMCINOLONE 0.1 % CREAM : EUCERIN) CREA Apply 1 application topically 2 (two) times daily as needed. 1 each 2   Facility-Administered Medications Prior to Visit  Medication Dose Route Frequency Provider Last Rate Last Dose  . 0.9 %  sodium chloride infusion  500 mL Intravenous Continuous Nandigam, Venia Minks, MD        Allergies  Allergen Reactions  . Peanuts [Peanut Oil]     Throat itches and she has stomach pains    ROS Review of Systems Review of Systems  Constitutional: Negative for activity change, appetite change, chills and fever.  Respiratory: Negative for cough, shortness of breath and wheezing.   Gastrointestinal: Negative for diarrhea, nausea and vomiting.  Genitourinary: Negative for difficulty urinating, dysuria, flank pain and hematuria.  Musculoskeletal: Negative for back pain, joint swelling and neck pain.  Neurological: Negative for dizziness, speech difficulty, light-headedness and numbness.  See HPI. All other review of systems negative.     Objective:   BP 130/85 (BP Location: Right Arm, Patient Position: Sitting, Cuff Size: Large)   Pulse 80   Temp 98.9 F (37.2 C) (Oral)   Resp 16   Ht 5' 9.25" (1.759 m)   Wt 213 lb 9.6 oz (96.9 kg)   LMP 05/08/2017   SpO2 99%   BMI 31.32 kg/m  Wt Readings from Last 3 Encounters:  12/03/18 213 lb 9.6 oz (96.9 kg)  11/30/18 214 lb 3.2 oz (97.2 kg)  07/01/18 212 lb (96.2 kg)    Physical Exam  Constitutional: She is oriented to person, place, and time. She appears well-developed and well-nourished.  HENT:  Head: Normocephalic and atraumatic.  Pulmonary/Chest: Effort normal.  Neurological: She is alert and oriented to person, place, and time. She has normal reflexes.  Psychiatric:  She has a normal mood and  affect. Her behavior is normal. Judgment and thought content normal.      Packing removed and replaced    There are no preventive care reminders to display for this patient.  There are no preventive care reminders to display for this patient.  Lab Results  Component Value Date   TSH 1.960 12/29/2017   Lab Results  Component Value Date   WBC 5.8 07/01/2018   HGB 13.5 07/01/2018   HCT 41.1 07/01/2018   MCV 84 07/01/2018   PLT 281 07/01/2018   Lab Results  Component Value Date   NA 140 07/01/2018   K 4.7 07/01/2018   CO2 23 07/01/2018   GLUCOSE 112 (H) 07/01/2018   BUN 17 07/01/2018   CREATININE 0.83 07/01/2018   BILITOT 0.4 07/01/2018   ALKPHOS 56 07/01/2018   AST 24 07/01/2018   ALT 26 07/01/2018   PROT 8.0 07/01/2018   ALBUMIN 5.0 07/01/2018   CALCIUM 10.1 07/01/2018   Lab Results  Component Value Date   CHOL 208 (H) 07/01/2018   Lab Results  Component Value Date   HDL 32 (L) 07/01/2018   Lab Results  Component Value Date   LDLCALC 112 (H) 07/01/2018   Lab Results  Component Value Date   TRIG 322 (H) 07/01/2018   Lab Results  Component Value Date   CHOLHDL 6.5 (H) 07/01/2018   Lab Results  Component Value Date   HGBA1C 6.1 (H) 07/01/2018      Assessment & Plan:   Problem List Items Addressed This Visit    None    Visit Diagnoses    Visit for wound care    -  Primary    Family history of Alzheimer's disease    - will refer to Neurology for discussing regarding screening for Alzheimer's disease.  Since she is 12 I believe this would be the best course of action while she is asymptomatic.       No orders of the defined types were placed in this encounter.   Follow-up: No follow-ups on file.    Forrest Moron, MD

## 2018-12-03 NOTE — Patient Instructions (Addendum)
   If you have lab work done today you will be contacted with your lab results within the next 2 weeks.  If you have not heard from us then please contact us. The fastest way to get your results is to register for My Chart.   IF you received an x-ray today, you will receive an invoice from [facility]. Please contact [facility] at [phone] with questions or concerns regarding your invoice.   IF you received labwork today, you will receive an invoice from [facility]. Please contact [facility] at [phone] with questions or concerns regarding your invoice.   Our billing staff will not be able to assist you with questions regarding bills from these companies.  You will be contacted with the lab results as soon as they are available. The fastest way to get your results is to activate your My Chart account. Instructions are located on the last page of this paperwork. If you have not heard from us regarding the results in 2 weeks, please contact this office.     Wound Care, Adult Taking care of your wound properly can help to prevent pain, infection, and scarring. It can also help your wound to heal more quickly. How to care for your wound Wound care      Follow instructions from your health care provider about how to take care of your wound. Make sure you: ? Wash your hands with soap and water before you change the bandage (dressing). If soap and water are not available, use hand sanitizer. ? Change your dressing as told by your health care provider. ? Leave stitches (sutures), skin glue, or adhesive strips in place. These skin closures may need to stay in place for 2 weeks or longer. If adhesive strip edges start to loosen and curl up, you may trim the loose edges. Do not remove adhesive strips completely unless your health care provider tells you to do that.  Check your wound area every day for signs of infection. Check for: ? Redness, swelling, or pain. ? Fluid or  blood. ? Warmth. ? Pus or a bad smell.  Ask your health care provider if you should clean the wound with mild soap and water. Doing this may include: ? Using a clean towel to pat the wound dry after cleaning it. Do not rub or scrub the wound. ? Applying a cream or ointment. Do this only as told by your health care provider. ? Covering the incision with a clean dressing.  Ask your health care provider when you can leave the wound uncovered.  Keep the dressing dry until your health care provider says it can be removed. Do not take baths, swim, use a hot tub, or do anything that would put the wound underwater until your health care provider approves. Ask your health care provider if you can take showers. You may only be allowed to take sponge baths. Medicines   If you were prescribed an antibiotic medicine, cream, or ointment, take or use the antibiotic as told by your health care provider. Do not stop taking or using the antibiotic even if your condition improves.  Take over-the-counter and prescription medicines only as told by your health care provider. If you were prescribed pain medicine, take it 30 or more minutes before you do any wound care or as told by your health care provider. General instructions  Return to your normal activities as told by your health care provider. Ask your health care provider what activities are   safe.  Do not scratch or pick at the wound.  Do not use any products that contain nicotine or tobacco, such as cigarettes and e-cigarettes. These may delay wound healing. If you need help quitting, ask your health care provider.  Keep all follow-up visits as told by your health care provider. This is important.  Eat a diet that includes protein, vitamin A, vitamin C, and other nutrient-rich foods to help the wound heal. ? Foods rich in protein include meat, dairy, beans, nuts, and other sources. ? Foods rich in vitamin A include carrots and dark green, leafy  vegetables. ? Foods rich in vitamin C include citrus, tomatoes, and other fruits and vegetables. ? Nutrient-rich foods have protein, carbohydrates, fat, vitamins, or minerals. Eat a variety of healthy foods including vegetables, fruits, and whole grains. Contact a health care provider if:  You received a tetanus shot and you have swelling, severe pain, redness, or bleeding at the injection site.  Your pain is not controlled with medicine.  You have redness, swelling, or pain around the wound.  You have fluid or blood coming from the wound.  Your wound feels warm to the touch.  You have pus or a bad smell coming from the wound.  You have a fever or chills.  You are nauseous or you vomit.  You are dizzy. Get help right away if:  You have a red streak going away from your wound.  The edges of the wound open up and separate.  Your wound is bleeding, and the bleeding does not stop with gentle pressure.  You have a rash.  You faint.  You have trouble breathing. Summary  Always wash your hands with soap and water before changing your bandage (dressing).  To help with healing, eat foods that are rich in protein, vitamin A, vitamin C, and other nutrients.  Check your wound every day for signs of infection. Contact your health care provider if you suspect that your wound is infected. This information is not intended to replace advice given to you by your health care provider. Make sure you discuss any questions you have with your health care provider. Document Released: 09/03/2008 Document Revised: 01/06/2018 Document Reviewed: 06/11/2016 Elsevier Interactive Patient Education  2019 [employer]vier Inc[employer]  Wound Care, Adult Taking care of your wound properly can help to prevent pain, infection, and scarring. It can also help your wound to heal more quickly. How to care for your wound Wound care      Follow instructions from your health care provider about how to take care of your  wound. Make sure you: ? Wash your hands with soap and water before you change the bandage (dressing). If soap and water are not available, use hand sanitizer. ? Change your dressing as told by your health care provider. ? Leave stitches (sutures), skin glue, or adhesive strips in place. These skin closures may need to stay in place for 2 weeks or longer. If adhesive strip edges start to loosen and curl up, you may trim the loose edges. Do not remove adhesive strips completely unless your health care provider tells you to do that.  Check your wound area every day for signs of infection. Check for: ? Redness, swelling, or pain. ? Fluid or blood. ? Warmth. ? Pus or a bad smell.  Ask your health care provider if you should clean the wound with mild soap and water. Doing this may include: ? Using a clean towel to pat the wound dry after  cleaning it. Do not rub or scrub the wound. ? Applying a cream or ointment. Do this only as told by your health care provider. ? Covering the incision with a clean dressing.  Ask your health care provider when you can leave the wound uncovered.  Keep the dressing dry until your health care provider says it can be removed. Do not take baths, swim, use a hot tub, or do anything that would put the wound underwater until your health care provider approves. Ask your health care provider if you can take showers. You may only be allowed to take sponge baths. Medicines   If you were prescribed an antibiotic medicine, cream, or ointment, take or use the antibiotic as told by your health care provider. Do not stop taking or using the antibiotic even if your condition improves.  Take over-the-counter and prescription medicines only as told by your health care provider. If you were prescribed pain medicine, take it 30 or more minutes before you do any wound care or as told by your health care provider. General instructions  Return to your normal activities as told by your  health care provider. Ask your health care provider what activities are safe.  Do not scratch or pick at the wound.  Do not use any products that contain nicotine or tobacco, such as cigarettes and e-cigarettes. These may delay wound healing. If you need help quitting, ask your health care provider.  Keep all follow-up visits as told by your health care provider. This is important.  Eat a diet that includes protein, vitamin A, vitamin C, and other nutrient-rich foods to help the wound heal. ? Foods rich in protein include meat, dairy, beans, nuts, and other sources. ? Foods rich in vitamin A include carrots and dark green, leafy vegetables. ? Foods rich in vitamin C include citrus, tomatoes, and other fruits and vegetables. ? Nutrient-rich foods have protein, carbohydrates, fat, vitamins, or minerals. Eat a variety of healthy foods including vegetables, fruits, and whole grains. Contact a health care provider if:  You received a tetanus shot and you have swelling, severe pain, redness, or bleeding at the injection site.  Your pain is not controlled with medicine.  You have redness, swelling, or pain around the wound.  You have fluid or blood coming from the wound.  Your wound feels warm to the touch.  You have pus or a bad smell coming from the wound.  You have a fever or chills.  You are nauseous or you vomit.  You are dizzy. Get help right away if:  You have a red streak going away from your wound.  The edges of the wound open up and separate.  Your wound is bleeding, and the bleeding does not stop with gentle pressure.  You have a rash.  You faint.  You have trouble breathing. Summary  Always wash your hands with soap and water before changing your bandage (dressing).  To help with healing, eat foods that are rich in protein, vitamin A, vitamin C, and other nutrients.  Check your wound every day for signs of infection. Contact your health care provider if you  suspect that your wound is infected. This information is not intended to replace advice given to you by your health care provider. Make sure you discuss any questions you have with your health care provider. Document Released: 09/03/2008 Document Revised: 01/06/2018 Document Reviewed: 06/11/2016 Elsevier Interactive Patient Education  2019 [employer]

## 2018-12-04 ENCOUNTER — Encounter: Payer: Self-pay | Admitting: Neurology

## 2018-12-05 ENCOUNTER — Encounter: Payer: Self-pay | Admitting: Family Medicine

## 2018-12-05 ENCOUNTER — Telehealth: Payer: Self-pay | Admitting: General Practice

## 2018-12-05 ENCOUNTER — Ambulatory Visit (INDEPENDENT_AMBULATORY_CARE_PROVIDER_SITE_OTHER): Payer: BC Managed Care – PPO | Admitting: Family Medicine

## 2018-12-05 ENCOUNTER — Other Ambulatory Visit: Payer: Self-pay

## 2018-12-05 VITALS — BP 131/82 | HR 68 | Temp 98.6°F | Ht 69.25 in | Wt 215.8 lb

## 2018-12-05 DIAGNOSIS — Z5189 Encounter for other specified aftercare: Secondary | ICD-10-CM | POA: Diagnosis not present

## 2018-12-05 DIAGNOSIS — Z4800 Encounter for change or removal of nonsurgical wound dressing: Secondary | ICD-10-CM

## 2018-12-05 NOTE — Patient Instructions (Signed)
° ° ° °  If you have lab work done today you will be contacted with your lab results within the next 2 weeks.  If you have not heard from us then please contact us. The fastest way to get your results is to register for My Chart. ° ° °IF you received an x-ray today, you will receive an invoice from [facility]. Please contact [facility] at [phone] with questions or concerns regarding your invoice.  ° °IF you received labwork today, you will receive an invoice from [facility]. Please contact [facility] at [phone] with questions or concerns regarding your invoice.  ° °Our billing staff will not be able to assist you with questions regarding bills from these companies. ° °You will be contacted with the lab results as soon as they are available. The fastest way to get your results is to activate your My Chart account. Instructions are located on the last page of this paperwork. If you have not heard from us regarding the results in 2 weeks, please contact this office. °  ° ° ° °

## 2018-12-05 NOTE — Telephone Encounter (Signed)
Per [facility] please schedule the pt. With dr. Alvy Bimlersagardia on 12/07/18 for wound care on 11:20am

## 2018-12-05 NOTE — Progress Notes (Signed)
Established Patient Office Visit  Subjective:  Patient ID: Kendra Saunders, female    DOB: 12/11/1965  Age: 52 y.o. MRN: [id number]  CC:  Chief Complaint  Patient presents with  . Wound Check    HPI Kendra Saunders presents for   Patient is here for packing change No fevers or chills Last dressing change was 11/03/18  Past Medical History:  Diagnosis Date  . Anxiety   . Asthma    childhood; last Albuterol use elementary school.  . Depression   . Migraine   . Seasonal allergies   . Sickle cell trait [facility]     Past Surgical History:  Procedure Laterality Date  . EYE SURGERY     R orbital fracture  . HAMMER TOE SURGERY    . orbital right fracture  1995  . OVARIAN CYST REMOVAL    . TONSILLECTOMY    . TUBAL LIGATION      Family History  Problem Relation Age of Onset  . Cancer Mother 79       breast cancer  . Cancer Father 70       brain cancer; prostate cancer  . Heart disease Brother 39       heart attack  . Cancer Paternal Grandfather        breast cancer  . Lupus Daughter   . Cancer Sister        breast cancer  . Cancer Sister 73       breast cancer  . Alzheimer's disease Maternal Grandmother 42  . Alzheimer's disease Maternal Aunt 70  . Alzheimer's disease Maternal Aunt 70  . Alzheimer's disease Maternal Aunt 70  . Colon cancer Neg Hx     Social History   Socioeconomic History  . Marital status: Single    Spouse name: n/a  . Number of children: 2  . Years of education: Master's   . Highest education level: Not on file  Occupational History  . Occupation: Magazine features editor: [employer]  Social Needs  . Financial resource strain: Not on file  . Food insecurity:    Worry: Not on file    Inability: Not on file  . Transportation needs:    Medical: Not on file    Non-medical: Not on file  Tobacco Use  . Smoking status: Never Smoker  . Smokeless tobacco: Never Used  Substance and Sexual Activity  . Alcohol use: Yes   Comment: rarely  . Drug use: No  . Sexual activity: Yes    Partners: Male  Lifestyle  . Physical activity:    Days per week: Not on file    Minutes per session: Not on file  . Stress: Not on file  Relationships  . Social connections:    Talks on phone: Not on file    Gets together: Not on file    Attends religious service: Not on file    Active member of club or organization: Not on file    Attends meetings of clubs or organizations: Not on file    Relationship status: Not on file  . Intimate partner violence:    Fear of current or ex partner: Not on file    Emotionally abused: Not on file    Physically abused: Not on file    Forced sexual activity: Not on file  Other Topics Concern  . Not on file  Social History Narrative   Marital status: divorced since 2002; +dating seriously x 7 years.  Children:  2 children (28, 20); no grandchildren.      Lives:  with her two children.      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      Tobacco: none       Alcohol: wine once per week.       Exercise:  Walking 3 days per week      Seatbelt: 100%      Guns: none      Sexual activity:  Total sexual partners = 20.  No STDs.  Last STD testing never.   Date males.      Outpatient Medications Prior to Visit  Medication Sig Dispense Refill  . doxycycline (VIBRAMYCIN) 100 MG capsule Take 1 capsule (100 mg total) by mouth 2 (two) times daily. 20 capsule 0  . fluconazole (DIFLUCAN) 150 MG tablet Take 1 pill if needed for yeast.  After 3 days repeat if needed. 2 tablet 0  . FLUoxetine (PROZAC) 20 MG tablet TAKE 2 TABLETS(40 MG) BY MOUTH DAILY 60 tablet 5  . rizatriptan (MAXALT-MLT) 10 MG disintegrating tablet Take 1 tablet (10 mg total) by mouth as needed for migraine. May repeat in 2 hours if needed 8 tablet 5  . Triamcinolone Acetonide (TRIAMCINOLONE 0.1 % CREAM : EUCERIN) CREA Apply 1 application topically 2 (two) times daily as needed. 1 each 2    Facility-Administered Medications Prior to Visit  Medication Dose Route Frequency Provider Last Rate Last Dose  . 0.9 %  sodium chloride infusion  500 mL Intravenous Continuous Nandigam, Eleonore ChiquitoKavitha V, MD        Allergies  Allergen Reactions  . Peanuts [Peanut Oil]     Throat itches and she has stomach pains    ROS Review of Systems    Objective:    Physical Exam  BP 131/82 (BP Location: Right Arm, Patient Position: Sitting, Cuff Size: Large)   Pulse 68   Temp 98.6 F (37 C) (Oral)   Ht 5' 9.25" (1.759 m)   Wt 215 lb 12.8 oz (97.9 kg)   LMP 05/08/2017   SpO2 100%   BMI 31.64 kg/m  Wt Readings from Last 3 Encounters:  12/05/18 215 lb 12.8 oz (97.9 kg)  12/03/18 213 lb 9.6 oz (96.9 kg)  11/30/18 214 lb 3.2 oz (97.2 kg)   Area without induration Wound packing removed and irrigated with saline  Repacked with tape   There are no preventive care reminders to display for this patient.  There are no preventive care reminders to display for this patient.  Lab Results  Component Value Date   TSH 1.960 12/29/2017   Lab Results  Component Value Date   WBC 5.8 07/01/2018   HGB 13.5 07/01/2018   HCT 41.1 07/01/2018   MCV 84 07/01/2018   PLT 281 07/01/2018   Lab Results  Component Value Date   NA 140 07/01/2018   K 4.7 07/01/2018   CO2 23 07/01/2018   GLUCOSE 112 (H) 07/01/2018   BUN 17 07/01/2018   CREATININE 0.83 07/01/2018   BILITOT 0.4 07/01/2018   ALKPHOS 56 07/01/2018   AST 24 07/01/2018   ALT 26 07/01/2018   PROT 8.0 07/01/2018   ALBUMIN 5.0 07/01/2018   CALCIUM 10.1 07/01/2018   Lab Results  Component Value Date   CHOL 208 (H) 07/01/2018   Lab Results  Component Value Date   HDL 32 (L) 07/01/2018   Lab Results  Component Value Date   LDLCALC  112 (H) 07/01/2018   Lab Results  Component Value Date   TRIG 322 (H) 07/01/2018   Lab Results  Component Value Date   CHOLHDL 6.5 (H) 07/01/2018   Lab Results  Component Value Date   HGBA1C 6.1  (H) 07/01/2018      Assessment & Plan:   Problem List Items Addressed This Visit    None    Visit Diagnoses    Change or removal of wound dressing    -  Primary   Visit for wound care          Return on 11/30 for wound change  Healing well  Continue the course Home care reviewed  No orders of the defined types were placed in this encounter.   Follow-up: No follow-ups on file.    Doristine BosworthZoe A Chanita Boden, MD

## 2018-12-07 ENCOUNTER — Ambulatory Visit (INDEPENDENT_AMBULATORY_CARE_PROVIDER_SITE_OTHER): Payer: BC Managed Care – PPO | Admitting: Emergency Medicine

## 2018-12-07 ENCOUNTER — Encounter: Payer: Self-pay | Admitting: Emergency Medicine

## 2018-12-07 ENCOUNTER — Other Ambulatory Visit: Payer: Self-pay

## 2018-12-07 VITALS — BP 118/74 | HR 73 | Temp 98.3°F | Resp 16 | Wt 216.0 lb

## 2018-12-07 DIAGNOSIS — L729 Follicular cyst of the skin and subcutaneous tissue, unspecified: Secondary | ICD-10-CM | POA: Diagnosis not present

## 2018-12-07 DIAGNOSIS — Z4800 Encounter for change or removal of nonsurgical wound dressing: Secondary | ICD-10-CM

## 2018-12-07 NOTE — Progress Notes (Signed)
Raymond Gurneyecolia Rossbach 52 y.o.   Chief Complaint  Patient presents with  . WOUND CARE    UPPER BACK -left     HISTORY OF PRESENT ILLNESS: This is a 52 y.o. female here for wound recheck.  Status post incision and drainage of left upper back cyst done on 11/30/2018.  Still taking antibiotics.  Doing well.  Has no complaints except for itching around the area.  HPI   Prior to Admission medications   Medication Sig Start Date End Date Taking? Authorizing Provider  doxycycline (VIBRAMYCIN) 100 MG capsule Take 1 capsule (100 mg total) by mouth 2 (two) times daily. 11/30/18  Yes Peyton NajjarHopper, David H, MD  fluconazole (DIFLUCAN) 150 MG tablet Take 1 pill if needed for yeast.  After 3 days repeat if needed. 11/30/18  Yes Peyton NajjarHopper, David H, MD  FLUoxetine (PROZAC) 20 MG tablet TAKE 2 TABLETS(40 MG) BY MOUTH DAILY 07/01/18  Yes Ethelda ChickSmith, Kristi M, MD  rizatriptan (MAXALT-MLT) 10 MG disintegrating tablet Take 1 tablet (10 mg total) by mouth as needed for migraine. May repeat in 2 hours if needed 07/01/18  Yes Ethelda ChickSmith, Kristi M, MD  Triamcinolone Acetonide (TRIAMCINOLONE 0.1 % CREAM : EUCERIN) CREA Apply 1 application topically 2 (two) times daily as needed. 07/01/18  Yes Ethelda ChickSmith, Kristi M, MD    Allergies  Allergen Reactions  . Peanuts [Peanut Oil]     Throat itches and she has stomach pains    Patient Active Problem List   Diagnosis Date Noted  . Prediabetes 07/30/2018  . Thyroid nodule 07/02/2018  . Anxiety and depression 01/09/2016  . DUB (dysfunctional uterine bleeding) 01/09/2016  . Hypersomnolence 01/09/2016  . Family history of breast cancer in mother 01/09/2016  . Overweight (BMI 25.0-29.9) 10/23/2014  . 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 SICKLE CELL TRAIT 06/24/2007  . Migraine without aura 06/24/2007    Past Medical History:  Diagnosis Date  . Anxiety   . Asthma    childhood; last Albuterol use elementary school.  . Depression   . Migraine   . Seasonal allergies   . Sickle cell trait [facility](HCC)      Past Surgical History:  Procedure Laterality Date  . EYE SURGERY     R orbital fracture  . HAMMER TOE SURGERY    . orbital right fracture  1995  . OVARIAN CYST REMOVAL    . TONSILLECTOMY    . TUBAL LIGATION      Social History   Socioeconomic History  . Marital status: Single    Spouse name: n/a  . Number of children: 2  . Years of education: Master's   . Highest education level: Not on file  Occupational History  . Occupation: Magazine features editorTEACHER    Employer: [employer]  Social Needs  . Financial resource strain: Not on file  . Food insecurity:    Worry: Not on file    Inability: Not on file  . Transportation needs:    Medical: Not on file    Non-medical: Not on file  Tobacco Use  . Smoking status: Never Smoker  . Smokeless tobacco: Never Used  Substance and Sexual Activity  . Alcohol use: Yes    Comment: rarely  . Drug use: No  . Sexual activity: Yes    Partners: Male  Lifestyle  . Physical activity:    Days per week: Not on file    Minutes per session: Not on file  . Stress: Not on file  Relationships  . Social connections:  Talks on phone: Not on file    Gets together: Not on file    Attends religious service: Not on file    Active member of club or organization: Not on file    Attends meetings of clubs or organizations: Not on file    Relationship status: Not on file  . Intimate partner violence:    Fear of current or ex partner: Not on file    Emotionally abused: Not on file    Physically abused: Not on file    Forced sexual activity: Not on file  Other Topics Concern  . Not on file  Social History Narrative   Marital status: divorced since 2002; +dating seriously x 7 years.      Children:  2 children (28, 20); no grandchildren.      Lives:  with her two children.      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      Tobacco: none       Alcohol: wine once per week.       Exercise:  Walking 3 days per  week      Seatbelt: 100%      Guns: none      Sexual activity:  Total sexual partners = 20.  No STDs.  Last STD testing never.   Date males.      Family History  Problem Relation Age of Onset  . Cancer Mother 55       breast cancer  . Cancer Father 58       brain cancer; prostate cancer  . Heart disease Brother 39       heart attack  . Cancer Paternal Grandfather        breast cancer  . Lupus Daughter   . Cancer Sister        breast cancer  . Cancer Sister 7       breast cancer  . Alzheimer's disease Maternal Grandmother 32  . Alzheimer's disease Maternal Aunt 70  . Alzheimer's disease Maternal Aunt 70  . Alzheimer's disease Maternal Aunt 70  . Colon cancer Neg Hx      Review of Systems  Constitutional: Negative for chills and fever.  Respiratory: Negative for shortness of breath.   Cardiovascular: Negative for chest pain.  Gastrointestinal: Negative for abdominal pain, diarrhea, nausea and vomiting.    Vitals:   12/07/18 1144  BP: 118/74  Pulse: 73  Resp: 16  Temp: 98.3 F (36.8 C)  SpO2: 98%    Physical Exam Constitutional:      Appearance: Normal appearance.  HENT:     Head: Normocephalic.  Eyes:     Extraocular Movements: Extraocular movements intact.  Neck:     Musculoskeletal: Normal range of motion.  Cardiovascular:     Rate and Rhythm: Normal rate.  Pulmonary:     Effort: Pulmonary effort is normal.  Musculoskeletal: Normal range of motion.  Skin:    Comments: Left upper back.  Packing still in place.  No erythema.  Packing removed.  No purulent drainage.  No signs of infection.  No repacking at this time.  Bactroban and dressing applied.  Neurological:     Mental Status: She is alert and oriented to person, place, and time.  Psychiatric:        Mood and Affect: Mood normal.        Behavior: Behavior normal.      ASSESSMENT & PLAN: Golden was seen today for  wound care.  Diagnoses and all orders for this visit:  Cutaneous  cyst Comments: improved/no infection  Change or removal of wound dressing    Patient Instructions       If you have lab work done today you will be contacted with your lab results within the next 2 weeks.  If you have not heard from us then please contact us. The fastest way to get your results is to register for My Chart.   IF you received an x-ray today, you will receive an invoice from [facility]. Please contact [facility] at [phone] with questions or concerns regarding your invoice.   IF you received labwork today, you will receive an invoice from [facility]. Please contact [facility] at [phone] with questions or concerns regarding your invoice.   Our billing staff will not be able to assist you with questions regarding bills from these companies.  You will be contacted with the lab results as soon as they are available. The fastest way to get your results is to activate your My Chart account. Instructions are located on the last page of this paperwork. If you have not heard from us regarding the results in 2 weeks, please contact this office.     Wound Care, Adult Taking care of your wound properly can help to prevent pain, infection, and scarring. It can also help your wound to heal more quickly. How to care for your wound Wound care      Follow instructions from your health care provider about how to take care of your wound. Make sure you: ? Wash your hands with soap and water before you change the bandage (dressing). If soap and water are not available, use hand sanitizer. ? Change your dressing as told by your health care provider. ? Leave stitches (sutures), skin glue, or adhesive strips in place. These skin closures may need to stay in place for 2 weeks or longer. If adhesive strip edges start to loosen and curl up, you may trim the loose edges. Do not remove adhesive strips completely unless your health care provider tells you to do  that.  Check your wound area every day for signs of infection. Check for: ? Redness, swelling, or pain. ? Fluid or blood. ? Warmth. ? Pus or a bad smell.  Ask your health care provider if you should clean the wound with mild soap and water. Doing this may include: ? Using a clean towel to pat the wound dry after cleaning it. Do not rub or scrub the wound. ? Applying a cream or ointment. Do this only as told by your health care provider. ? Covering the incision with a clean dressing.  Ask your health care provider when you can leave the wound uncovered.  Keep the dressing dry until your health care provider says it can be removed. Do not take baths, swim, use a hot tub, or do anything that would put the wound underwater until your health care provider approves. Ask your health care provider if you can take showers. You may only be allowed to take sponge baths. Medicines   If you were prescribed an antibiotic medicine, cream, or ointment, take or use the antibiotic as told by your health care provider. Do not stop taking or using the antibiotic even if your condition improves.  Take over-the-counter and prescription medicines only as told by your health care provider. If you were prescribed pain medicine, take it 30 or more minutes before you do any wound  care or as told by your health care provider. General instructions  Return to your normal activities as told by your health care provider. Ask your health care provider what activities are safe.  Do not scratch or pick at the wound.  Do not use any products that contain nicotine or tobacco, such as cigarettes and e-cigarettes. These may delay wound healing. If you need help quitting, ask your health care provider.  Keep all follow-up visits as told by your health care provider. This is important.  Eat a diet that includes protein, vitamin A, vitamin C, and other nutrient-rich foods to help the wound heal. ? Foods rich in protein  include meat, dairy, beans, nuts, and other sources. ? Foods rich in vitamin A include carrots and dark green, leafy vegetables. ? Foods rich in vitamin C include citrus, tomatoes, and other fruits and vegetables. ? Nutrient-rich foods have protein, carbohydrates, fat, vitamins, or minerals. Eat a variety of healthy foods including vegetables, fruits, and whole grains. Contact a health care provider if:  You received a tetanus shot and you have swelling, severe pain, redness, or bleeding at the injection site.  Your pain is not controlled with medicine.  You have redness, swelling, or pain around the wound.  You have fluid or blood coming from the wound.  Your wound feels warm to the touch.  You have pus or a bad smell coming from the wound.  You have a fever or chills.  You are nauseous or you vomit.  You are dizzy. Get help right away if:  You have a red streak going away from your wound.  The edges of the wound open up and separate.  Your wound is bleeding, and the bleeding does not stop with gentle pressure.  You have a rash.  You faint.  You have trouble breathing. Summary  Always wash your hands with soap and water before changing your bandage (dressing).  To help with healing, eat foods that are rich in protein, vitamin A, vitamin C, and other nutrients.  Check your wound every day for signs of infection. Contact your health care provider if you suspect that your wound is infected. This information is not intended to replace advice given to you by your health care provider. Make sure you discuss any questions you have with your health care provider. Document Released: 09/03/2008 Document Revised: 01/06/2018 Document Reviewed: 06/11/2016 Elsevier Interactive Patient Education  2019 Elsevier Inc.      Edwina Barth, MD [facility]

## 2018-12-07 NOTE — Patient Instructions (Addendum)
   If you have lab work done today you will be contacted with your lab results within the next 2 weeks.  If you have not heard from us then please contact us. The fastest way to get your results is to register for My Chart.   IF you received an x-ray today, you will receive an invoice from [facility]. Please contact [facility] at [phone] with questions or concerns regarding your invoice.   IF you received labwork today, you will receive an invoice from [facility]. Please contact [facility] at [phone] with questions or concerns regarding your invoice.   Our billing staff will not be able to assist you with questions regarding bills from these companies.  You will be contacted with the lab results as soon as they are available. The fastest way to get your results is to activate your My Chart account. Instructions are located on the last page of this paperwork. If you have not heard from us regarding the results in 2 weeks, please contact this office.     Wound Care, Adult Taking care of your wound properly can help to prevent pain, infection, and scarring. It can also help your wound to heal more quickly. How to care for your wound Wound care      Follow instructions from your health care provider about how to take care of your wound. Make sure you: ? Wash your hands with soap and water before you change the bandage (dressing). If soap and water are not available, use hand sanitizer. ? Change your dressing as told by your health care provider. ? Leave stitches (sutures), skin glue, or adhesive strips in place. These skin closures may need to stay in place for 2 weeks or longer. If adhesive strip edges start to loosen and curl up, you may trim the loose edges. Do not remove adhesive strips completely unless your health care provider tells you to do that.  Check your wound area every day for signs of infection. Check for: ? Redness, swelling, or pain. ? Fluid or  blood. ? Warmth. ? Pus or a bad smell.  Ask your health care provider if you should clean the wound with mild soap and water. Doing this may include: ? Using a clean towel to pat the wound dry after cleaning it. Do not rub or scrub the wound. ? Applying a cream or ointment. Do this only as told by your health care provider. ? Covering the incision with a clean dressing.  Ask your health care provider when you can leave the wound uncovered.  Keep the dressing dry until your health care provider says it can be removed. Do not take baths, swim, use a hot tub, or do anything that would put the wound underwater until your health care provider approves. Ask your health care provider if you can take showers. You may only be allowed to take sponge baths. Medicines   If you were prescribed an antibiotic medicine, cream, or ointment, take or use the antibiotic as told by your health care provider. Do not stop taking or using the antibiotic even if your condition improves.  Take over-the-counter and prescription medicines only as told by your health care provider. If you were prescribed pain medicine, take it 30 or more minutes before you do any wound care or as told by your health care provider. General instructions  Return to your normal activities as told by your health care provider. Ask your health care provider what activities are   safe.  Do not scratch or pick at the wound.  Do not use any products that contain nicotine or tobacco, such as cigarettes and e-cigarettes. These may delay wound healing. If you need help quitting, ask your health care provider.  Keep all follow-up visits as told by your health care provider. This is important.  Eat a diet that includes protein, vitamin A, vitamin C, and other nutrient-rich foods to help the wound heal. ? Foods rich in protein include meat, dairy, beans, nuts, and other sources. ? Foods rich in vitamin A include carrots and dark green, leafy  vegetables. ? Foods rich in vitamin C include citrus, tomatoes, and other fruits and vegetables. ? Nutrient-rich foods have protein, carbohydrates, fat, vitamins, or minerals. Eat a variety of healthy foods including vegetables, fruits, and whole grains. Contact a health care provider if:  You received a tetanus shot and you have swelling, severe pain, redness, or bleeding at the injection site.  Your pain is not controlled with medicine.  You have redness, swelling, or pain around the wound.  You have fluid or blood coming from the wound.  Your wound feels warm to the touch.  You have pus or a bad smell coming from the wound.  You have a fever or chills.  You are nauseous or you vomit.  You are dizzy. Get help right away if:  You have a red streak going away from your wound.  The edges of the wound open up and separate.  Your wound is bleeding, and the bleeding does not stop with gentle pressure.  You have a rash.  You faint.  You have trouble breathing. Summary  Always wash your hands with soap and water before changing your bandage (dressing).  To help with healing, eat foods that are rich in protein, vitamin A, vitamin C, and other nutrients.  Check your wound every day for signs of infection. Contact your health care provider if you suspect that your wound is infected. This information is not intended to replace advice given to you by your health care provider. Make sure you discuss any questions you have with your health care provider. Document Released: 09/03/2008 Document Revised: 01/06/2018 Document Reviewed: 06/11/2016 Elsevier Interactive Patient Education  2019 Elsevier Inc.  

## 2018-12-29 DIAGNOSIS — D259 Leiomyoma of uterus, unspecified: Secondary | ICD-10-CM | POA: Insufficient documentation

## 2018-12-29 DIAGNOSIS — J45909 Unspecified asthma, uncomplicated: Secondary | ICD-10-CM | POA: Insufficient documentation

## 2019-01-04 ENCOUNTER — Encounter: Payer: BC Managed Care – PPO | Admitting: Family Medicine

## 2019-01-25 ENCOUNTER — Encounter: Payer: BC Managed Care – PPO | Attending: Family Medicine | Admitting: Skilled Nursing Facility1

## 2019-01-25 ENCOUNTER — Encounter: Payer: Self-pay | Admitting: Skilled Nursing Facility1

## 2019-01-25 DIAGNOSIS — E119 Type 2 diabetes mellitus without complications: Secondary | ICD-10-CM | POA: Insufficient documentation

## 2019-01-25 NOTE — Progress Notes (Signed)
Diabetes Self-Management Education  Visit Type: First/Initial  01/25/2019  [name], identified by name and date of birth, is a 53 y.o. female with a diagnosis of Diabetes: Type 2.   ASSESSMENT  Height 5\' 10"  (1.778 m), weight 213 lb 12.8 oz (97 kg), last menstrual period 05/08/2017. Body mass index is 30.68 kg/m.  Pt states she is currently perimenopausal.  Pt states she works 2 jobs.  Pt was tearful in accepting she has diabetes.  Pt states she eats 3-4 apples a day.  Pt states she will get a glucometer covered by her insurance company and dietitian will teach pt how to test her blood sugar at next appt.  TANITA  BODY COMP RESULTS  01/25/2019   BMI (kg/m^2) 30.7   Fat Mass (lbs) 93.6   Fat Free Mass (lbs) 120.6   Total Body Water (lbs) 86.8    Goals: Aim for 80 ounces of water per day 2-3 CHO choices per meal with protein and non starchy vegetables Eat your fruit with protein  Walk for a minimum of 3 days a week 45 minutes   At next visit: give pt support group handout  Diabetes Self-Management Education - 01/25/19 1041      Visit Information   Visit Type  First/Initial      Initial Visit   Diabetes Type  Type 2    Are you currently following a meal plan?  No    Are you taking your medications as prescribed?  Not on Medications      Health Coping   How would you rate your overall health?  Fair      Psychosocial Assessment   Patient Belief/Attitude about Diabetes  Afraid    Self-management support  Family      Pre-Education Assessment   Patient understands the diabetes disease and treatment process.  Needs Instruction    Patient understands incorporating nutritional management into lifestyle.  Needs Instruction    Patient undertands incorporating physical activity into lifestyle.  Needs Instruction    Patient understands using medications safely.  Needs Instruction    Patient understands monitoring blood glucose, interpreting and using results   Needs Instruction    Patient understands prevention, detection, and treatment of acute complications.  Needs Instruction    Patient understands prevention, detection, and treatment of chronic complications.  Needs Instruction    Patient understands how to develop strategies to address psychosocial issues.  Needs Instruction    Patient understands how to develop strategies to promote health/change behavior.  Needs Instruction      Complications   Last HgB A1C per patient/outside source  6.8 %    How often do you check your blood sugar?  0 times/day (not testing)    Have you had a dilated eye exam in the past 12 months?  Yes    Have you had a dental exam in the past 12 months?  Yes    Are you checking your feet?  No      Dietary Intake   Breakfast  (7am) 2 slices of [place] bacon and coffee wth sugar free creamer somtimes 2 eggs with cheese or cereal     Snack (morning)  (10am) apple and oatmeal     Lunch  chicken with broccoli cheese and brown rice and apple    Snack (afternoon)  apple    Dinner  frozen pizza or fish sticks with honey glazed carrots     Snack (evening)  apple  Beverage(s)  67 oz, coffee      Exercise   Exercise Type  Light (walking / raking leaves)    How many days per week to you exercise?  3    How many minutes per day do you exercise?  45    Total minutes per week of exercise  135      Patient Education   Previous Diabetes Education  No    Disease state   Factors that contribute to the development of diabetes;Definition of diabetes, type 1 and 2, and the diagnosis of diabetes    Nutrition management   Role of diet in the treatment of diabetes and the relationship between the three main macronutrients and blood glucose level;Food label reading, portion sizes and measuring food.;Carbohydrate counting    Physical activity and exercise   Role of exercise on diabetes management, blood pressure control and cardiac health.;Identified with patient nutritional and/or  medication changes necessary with exercise.;Helped patient identify appropriate exercises in relation to his/her diabetes, diabetes complications and other health issue.    Monitoring  Purpose and frequency of SMBG.;Yearly dilated eye exam;Daily foot exams    Acute complications  Taught treatment of hypoglycemia - the 15 rule.;Discussed and identified patients' treatment of hyperglycemia.    Chronic complications  Dental care;Lipid levels, blood glucose control and heart disease    Psychosocial adjustment  Worked with patient to identify barriers to care and solutions;Role of stress on diabetes      Individualized Goals (developed by patient)   Physical Activity  30 minutes per day;Exercise 3-5 times per week;Exercise 5-7 days per week      Post-Education Assessment   Patient understands the diabetes disease and treatment process.  Demonstrates understanding / competency    Patient understands incorporating nutritional management into lifestyle.  Demonstrates understanding / competency    Patient undertands incorporating physical activity into lifestyle.  Demonstrates understanding / competency    Patient understands using medications safely.  Demonstrates understanding / competency    Patient understands monitoring blood glucose, interpreting and using results  Demonstrates understanding / competency    Patient understands prevention, detection, and treatment of acute complications.  Demonstrates understanding / competency    Patient understands prevention, detection, and treatment of chronic complications.  Demonstrates understanding / competency    Patient understands how to develop strategies to address psychosocial issues.  Demonstrates understanding / competency    Patient understands how to develop strategies to promote health/change behavior.  Demonstrates understanding / competency      Outcomes   Expected Outcomes  Demonstrated interest in learning. Expect positive outcomes    Future  DMSE  PRN    Program Status  Completed       Individualized Plan for Diabetes Self-Management Training:   Learning Objective:  Patient will have a greater understanding of diabetes self-management. Patient education plan is to attend individual and/or group sessions per assessed needs and concerns.   Plan:   There are no Patient Instructions on file for this visit.  Expected Outcomes:  Demonstrated interest in learning. Expect positive outcomes  Education material provided: ADA Diabetes: Your Take Control Guide, Food label handouts, Meal plan card, My Plate and Snack sheet  If problems or questions, patient to contact team via:  Phone  Future DSME appointment: PRN

## 2019-02-22 ENCOUNTER — Other Ambulatory Visit: Payer: Self-pay

## 2019-02-22 ENCOUNTER — Encounter: Payer: BC Managed Care – PPO | Attending: Family Medicine | Admitting: Skilled Nursing Facility1

## 2019-02-22 DIAGNOSIS — E119 Type 2 diabetes mellitus without complications: Secondary | ICD-10-CM | POA: Insufficient documentation

## 2019-02-22 NOTE — Progress Notes (Signed)
  Diabetes Self-Management Education  Visit Type:    02/22/2019  127 blood sugar reading in office today: last night roasted almonds and 3 chicken tenders and fries   Pt arrives having lost about 3 pounds. Pt states she has been walking 3 days a week for 40 minutes.  Pt is very motivated to make changes.   24 hr recall: 3 apples a day  2 cups of coffee with sugar free french vanilla creamer; 60-70 ounces of water Sometimes eggs and a piece of toast  Frozen fruit smoothie with almond milk and light and fit yogurt (8oz) 1 pack of oatmeal  Almond with salt Chicken salad sandwich with low calorie/5g carb bread  salad with avocado dressing   Fish or chicken or [place] meatloaf with cauliflower mashed or broccoli and cheese and honey glazed carrots   sugar free cookies  Body Composition Scale  Total Body Fat: 37.6 %  Visceral Fat: 10  Fat-Free Mass: 62.3  %   Total Body Water: 45.6  %  Muscle-Mass: 34.8  lbs  Body Fat Displacement:  Torso: 49  lbs Left Leg: 9.8 lbs Right Leg:9.8  lbs Left Arm: 4.9 lbs Right Arm: 4.9 lbs  Goals: Aim for 80 ounces of water per day 2-3 CHO choices per meal with protein and non starchy vegetables Eat your fruit with protein  Walk for a minimum of 4 days a week 45 minutes  Check your blood sugars 2 times a day fasting and 2 hours after a meal; check your blood sugar after your smoothie to determine if you need to make any changes  -Do soy milk and greek yogurt with single digits carbohydrate instead of almond and regular yogurt  -Choose whole wheat bread -Always bring your meter with you everywhere you go -Always Properly dispose of your needles:  -Discard in a hard plastic/metal container with a lid (something the needle can't puncture)  -Write Do Not Recycle on the outside of the container  -Example: A laundry detergent bottle -Never use the same needle more than once -Eat 2-3 carbohydrate choices for each meal and 1 for each snack -A  meal: carbohydrates, protein, vegetable -A snack: A Fruit OR Vegetable AND Protein  -Try to be more active -Always pay attention to your body keeping watchful of possible low blood sugar (below 70) or high blood sugar (above 200) -Check your feet every day looking for anything that was not there the day before

## 2019-02-22 NOTE — Patient Instructions (Addendum)
-  Do soy milk and greek yogurt with single digits carbohydrate instead of almond and regular yogurt   -Choose whole wheat bread  -Always bring your meter with you everywhere you go -Always Properly dispose of your needles:  -Discard in a hard plastic/metal container with a lid (something the needle can't puncture)  -Write Do Not Recycle on the outside of the container  -Example: A laundry detergent bottle -Never use the same needle more than once -Eat 2-3 carbohydrate choices for each meal and 1 for each snack -A meal: carbohydrates, protein, vegetable -A snack: A Fruit OR Vegetable AND Protein  -Try to be more active -Always pay attention to your body keeping watchful of possible low blood sugar (below 70) or high blood sugar (above 200)  -Check your feet every day looking for anything that was not there the day before

## 2019-03-15 ENCOUNTER — Ambulatory Visit: Payer: BC Managed Care – PPO | Admitting: Neurology

## 2019-04-06 ENCOUNTER — Ambulatory Visit: Payer: BC Managed Care – PPO | Admitting: Skilled Nursing Facility1

## 2019-09-16 ENCOUNTER — Other Ambulatory Visit: Payer: Self-pay

## 2019-09-16 DIAGNOSIS — Z20822 Contact with and (suspected) exposure to covid-19: Secondary | ICD-10-CM

## 2019-09-17 LAB — NOVEL CORONAVIRUS, NAA: SARS-CoV-2, NAA: NOT DETECTED

## 2019-12-13 ENCOUNTER — Ambulatory Visit: Payer: BC Managed Care – PPO | Attending: Internal Medicine

## 2019-12-13 DIAGNOSIS — Z20822 Contact with and (suspected) exposure to covid-19: Secondary | ICD-10-CM

## 2019-12-14 LAB — NOVEL CORONAVIRUS, NAA: SARS-CoV-2, NAA: NOT DETECTED

## 2020-10-18 ENCOUNTER — Other Ambulatory Visit: Payer: Self-pay

## 2020-10-18 ENCOUNTER — Ambulatory Visit
Admission: EM | Admit: 2020-10-18 | Discharge: 2020-10-18 | Disposition: A | Discharge status: Home / Self Care | Payer: BC Managed Care – PPO | Attending: Physician Assistant | Admitting: Physician Assistant

## 2020-10-18 DIAGNOSIS — N3090 Cystitis, unspecified without hematuria: Secondary | ICD-10-CM | POA: Insufficient documentation

## 2020-10-18 LAB — POCT URINALYSIS DIP (MANUAL ENTRY)
Bilirubin, UA: NEGATIVE
Glucose, UA: NEGATIVE mg/dL
Ketones, POC UA: NEGATIVE mg/dL
Nitrite, UA: POSITIVE — AB
Spec Grav, UA: 1.025 (ref 1.010–1.025)
Urobilinogen, UA: 1 E.U./dL
pH, UA: 5.5 (ref 5.0–8.0)

## 2020-10-18 LAB — POCT URINE PREGNANCY: Preg Test, Ur: NEGATIVE

## 2020-10-18 MED ORDER — FLUCONAZOLE 150 MG PO TABS: 150.0000 mg | ORAL_TABLET | Freq: Every day | ORAL | 0 refills | Status: DC

## 2020-10-18 MED ORDER — NITROFURANTOIN MONOHYD MACRO 100 MG PO CAPS: 100.0000 mg | ORAL_CAPSULE | Freq: Two times a day (BID) | ORAL | 0 refills | Status: DC

## 2020-10-18 NOTE — ED Provider Notes (Signed)
[facility]    CSN: [id number] Arrival date & time: 10/18/20  1712      History   Chief Complaint Chief Complaint  Patient presents with  . Urinary Tract Infection    symptomatic since yesterday    HPI Kendra Saunders is a 54 y.o. female.   54 year old female comes in for 2 day history of urinary symptoms. Urine odor, frequency, urgency. Some low back pain. Denies abdominal pain, nausea, vomiting. Denies fever, chills. Denies vaginal discharge, itching, spotting.      Past Medical History:  Diagnosis Date  . Anxiety   . Asthma    childhood; last Albuterol use elementary school.  . Depression   . Diabetes mellitus without complication (HCC)   . Migraine   . Seasonal allergies   . Sickle cell trait [facility])     Patient Active Problem List   Diagnosis Date Noted  . Prediabetes 07/30/2018  . Thyroid nodule 07/02/2018  . Anxiety and depression 01/09/2016  . DUB (dysfunctional uterine bleeding) 01/09/2016  . Hypersomnolence 01/09/2016  . Family history of breast cancer in mother 01/09/2016  . Overweight (BMI 25.0-29.9) 10/23/2014  . 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 SICKLE CELL TRAIT 06/24/2007  . Migraine without aura 06/24/2007    Past Surgical History:  Procedure Laterality Date  . EYE SURGERY     R orbital fracture  . HAMMER TOE SURGERY    . orbital right fracture  1995  . OVARIAN CYST REMOVAL    . TONSILLECTOMY    . TUBAL LIGATION      OB History   No obstetric history on file.      Home Medications    Prior to Admission medications   Medication Sig Start Date End Date Taking? Authorizing Provider  FLUoxetine (PROZAC) 20 MG tablet TAKE 2 TABLETS(40 MG) BY MOUTH DAILY 07/01/18  Yes Ethelda Chick, MD  rizatriptan (MAXALT-MLT) 10 MG disintegrating tablet Take 1 tablet (10 mg total) by mouth as needed for migraine. May repeat in 2 hours if needed 07/01/18  Yes Ethelda Chick, MD  fluconazole (DIFLUCAN) 150 MG tablet Take 1 tablet (150 mg  total) by mouth daily. Take second dose 72 hours later if symptoms still persists. 10/18/20   Cathie Hoops, Farris Blash V, PA-C  nitrofurantoin, macrocrystal-monohydrate, (MACROBID) 100 MG capsule Take 1 capsule (100 mg total) by mouth 2 (two) times daily. 10/18/20   [name], PA-C    Family History Family History  Problem Relation Age of Onset  . Cancer Mother 23       breast cancer  . Cancer Father 30       brain cancer; prostate cancer  . Heart disease Brother 39       heart attack  . Cancer Paternal Grandfather        breast cancer  . Lupus Daughter   . Cancer Sister        breast cancer  . Cancer Sister 44       breast cancer  . Alzheimer's disease Maternal Grandmother 66  . Alzheimer's disease Maternal Aunt 70  . Alzheimer's disease Maternal Aunt 70  . Alzheimer's disease Maternal Aunt 70  . Colon cancer Neg Hx     Social History Social History   Tobacco Use  . Smoking status: Never Smoker  . Smokeless tobacco: Never Used  Vaping Use  . Vaping Use: Never used  Substance Use Topics  . Alcohol use: Yes    Comment: rarely  . Drug  use: No     Allergies   Peanuts [peanut oil]   Review of Systems Review of Systems  Reason unable to perform ROS: See HPI as above.     Physical Exam Triage Vital Signs ED Triage Vitals  Enc Vitals Group     BP 10/18/20 1814 (!) 147/95     Pulse Rate 10/18/20 1814 63     Resp 10/18/20 1814 20     Temp 10/18/20 1814 97.7 F (36.5 C)     Temp Source 10/18/20 1814 Oral     SpO2 10/18/20 1814 99 %     Weight --      Height --      Head Circumference --      Peak Flow --      Pain Score 10/18/20 1835 2     Pain Loc --      Pain Edu? --      Excl. in GC? --    No data found.  Updated Vital Signs BP (!) 147/95 (BP Location: Left Arm)   Pulse 63   Temp 97.7 F (36.5 C) (Oral)   Resp 20   LMP 05/08/2017   SpO2 99%   Physical Exam Constitutional:      General: She is not in acute distress.    Appearance: Normal appearance. She is  well-developed. She is not toxic-appearing or diaphoretic.  HENT:     Head: Normocephalic and atraumatic.  Eyes:     Conjunctiva/sclera: Conjunctivae normal.     Pupils: Pupils are equal, round, and reactive to light.  Cardiovascular:     Rate and Rhythm: Normal rate and regular rhythm.  Pulmonary:     Effort: Pulmonary effort is normal. No respiratory distress.     Comments: LCTAB Abdominal:     Tenderness: There is no right CVA tenderness or left CVA tenderness.  Musculoskeletal:     Cervical back: Normal range of motion and neck supple.  Skin:    General: Skin is warm and dry.  Neurological:     Mental Status: She is alert and oriented to person, place, and time.      UC Treatments / Results  Labs (all labs ordered are listed, but only abnormal results are displayed) Labs Reviewed  POCT URINALYSIS DIP (MANUAL ENTRY) - Abnormal; Notable for the following components:      Result Value   Clarity, UA hazy (*)    Blood, UA trace-intact (*)    Protein Ur, POC trace (*)    Nitrite, UA Positive (*)    Leukocytes, UA Small (1+) (*)    All other components within normal limits  URINE CULTURE  POCT URINE PREGNANCY    EKG   Radiology No results found.  Procedures Procedures (including critical care time)  Medications Ordered in UC Medications - No data to display  Initial Impression / Assessment and Plan / UC Course  I have reviewed the triage vital signs and the nursing notes.  Pertinent labs & imaging results that were available during my care of the patient were reviewed by me and considered in my medical decision making (see chart for details).    Urine dipstick positive for UTI. Start antibiotics as directed. Push fluids. Return precautions given.  Final Clinical Impressions(s) / UC Diagnoses   Final diagnoses:  Cystitis    ED Prescriptions    Medication Sig Dispense Auth. Provider   fluconazole (DIFLUCAN) 150 MG tablet Take 1 tablet (150 mg total) by  mouth daily.  Take second dose 72 hours later if symptoms still persists. 2 tablet [name], PA-C   nitrofurantoin, macrocrystal-monohydrate, (MACROBID) 100 MG capsule Take 1 capsule (100 mg total) by mouth 2 (two) times daily. 10 capsule [name], PA-C     PDMP not reviewed this encounter.   [name], PA-C 10/18/20 1903

## 2020-10-18 NOTE — Discharge Instructions (Signed)
Your urine was positive for an urinary tract infection. Start macrobid as directed. Diflucan for yeast. Keep hydrated, urine should be clear to pale yellow in color. Monitor for any worsening of symptoms, fever, worsening abdominal pain, nausea/vomiting, flank pain, follow up for reevaluation.

## 2020-10-18 NOTE — ED Triage Notes (Signed)
Pt states she has had a odor when she urinates as well as frequency and urgency since yesterday. Pt is aox4 and ambulatory.

## 2020-10-21 LAB — URINE CULTURE: Culture: >=100000 — AB

## 2021-05-28 DIAGNOSIS — I10 Essential (primary) hypertension: Secondary | ICD-10-CM | POA: Insufficient documentation

## 2021-05-28 DIAGNOSIS — E7800 Pure hypercholesterolemia, unspecified: Secondary | ICD-10-CM | POA: Insufficient documentation

## 2021-07-17 DIAGNOSIS — E042 Nontoxic multinodular goiter: Secondary | ICD-10-CM | POA: Insufficient documentation

## 2023-05-02 ENCOUNTER — Encounter: Payer: Self-pay | Admitting: Podiatry

## 2023-05-02 ENCOUNTER — Ambulatory Visit (INDEPENDENT_AMBULATORY_CARE_PROVIDER_SITE_OTHER): Payer: BC Managed Care – PPO

## 2023-05-02 ENCOUNTER — Ambulatory Visit (INDEPENDENT_AMBULATORY_CARE_PROVIDER_SITE_OTHER): Payer: BC Managed Care – PPO | Admitting: Podiatry

## 2023-05-02 DIAGNOSIS — R7303 Prediabetes: Secondary | ICD-10-CM | POA: Diagnosis not present

## 2023-05-02 DIAGNOSIS — M205X1 Other deformities of toe(s) (acquired), right foot: Secondary | ICD-10-CM

## 2023-05-02 DIAGNOSIS — L84 Corns and callosities: Secondary | ICD-10-CM

## 2023-05-02 NOTE — Progress Notes (Signed)
  Subjective:  Patient ID: Kendra Saunders, female    DOB: 1966-03-22,   MRN: [id number]  Chief Complaint  Patient presents with   Callouses    Right foot 5th toe possible corn on the toe causing discomfort     57 y.o. female presents for concern of right pinky toe pain that has been going on for several months. Relates 10 years ago she had surgery with Dr. Wynelle Cleveland on both of her pinky toes. The left has done well but the right started to come back and wondering if anything can be done. Relates she does wear a lot of heels and tight shoes. She does try to wear as wide as she can but she does not want to stop wearing heels.  Patient is diabetic and last A1c was  Lab Results  Component Value Date   HGBA1C 6.1 (H) 07/01/2018   .   PCP:  Patient, No Pcp Per    . Denies any other pedal complaints. Denies n/v/f/c.   Past Medical History:  Diagnosis Date   Anxiety    Asthma    childhood; last Albuterol use elementary school.   Depression    Diabetes mellitus without complication (HCC)    Migraine    Seasonal allergies    Sickle cell trait (HCC)     Objective:  Physical Exam: Vascular: DP/PT pulses 2/4 bilateral. CFT <3 seconds. Normal hair growth on digits. No edema.  Skin. No lacerations or abrasions bilateral feet. Hyperkeratotic lesion noted to dorsum of right fifth digit at PIPJ.  Musculoskeletal: MMT 5/5 bilateral lower extremities in DF, PF, Inversion and Eversion. Deceased ROM in DF of ankle joint.  Adductovarus of fifth digit on right foot flexible with some bony prominence felt around the joint.  Neurological: Sensation intact to light touch.   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 Corns and callosities   3. Prediabetes      Plan:  Patient was evaluated and treated and all questions answered. -X-rays reviewed. Adductovarus rotation noted of the fifth digit and prominence of lateral head of proximal phalanx.  -Educated on hammertoes  and corns and treatment options  -Discussed padding including toe caps and crest pads. Patient has exhausted padding.  -Discussed as she has exhausted conservative treatment we can consider surgical correction and shaving of bone and repair of fifth digit hammertoe, adductovarus. Patient is in agreement with this plan.  -Informed surgical risk consent was reviewed and read aloud to the patient.  I reviewed the films.  I have discussed my findings with the patient in great detail.  I have discussed all risks including but not limited to infection, stiffness, scarring, limp, disability, deformity, damage to blood vessels and nerves, numbness, poor healing, need for braces, arthritis, chronic pain, amputation, death.  All benefits and realistic expectations discussed in great detail.  I have made no promises as to the outcome.  I have provided realistic expectations.  I have offered the patient a 2nd opinion, which they have declined and assured me they preferred to proceed despite the risks. Will plan for right fifth digit rotational arthroplasty July 16th.     Louann Sjogren, DPM

## 2023-05-22 ENCOUNTER — Encounter (INDEPENDENT_AMBULATORY_CARE_PROVIDER_SITE_OTHER): Payer: Self-pay

## 2023-05-23 ENCOUNTER — Telehealth: Payer: Self-pay | Admitting: Urology

## 2023-05-23 NOTE — Telephone Encounter (Signed)
DOS - 06/24/23  HAMMERTOE REPAIR 5TH RIGHT --- [address]  [employer] EFFECTIVE DATE - 12/09/22  DEDUCTIBLE - $1,250.00 W/ $1,250.00 REMAINING OOP - $4,890.00 W/ $4,741.47 REMAINING COINSURANCE - 20%   SPOKE WITH LATOYA WITH [employer] AND SHE STATED THAT FOR CPT CODE 60454 NO PRIOR AUTH IS REQUIRED.  CALL REF # LATOYA P. 05/23/23 AT 9:49 AM EST

## 2023-06-11 NOTE — Progress Notes (Signed)
Office: [phone]  /  Fax: [phone]   TeleHealth Visit:  This visit was completed with telemedicine (audio/video) technology. Sneha has verbally consented to this TeleHealth visit. The patient is located at home, the provider is located at home. The participants in this visit include the listed provider and patient. The visit was conducted today via MyChart video.  Initial Visit  Kendra Saunders was seen via virtual visit today to evaluate for treatment of obesity. She is interested in losing weight to improve overall health and reduce the risk of weight related complications. She presents today to review program treatment options, initial physical assessment, and evaluation.     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  She was referred by: Specialist- GYN  When asked what else they would like to accomplish? She states: Adopt healthier eating patterns, Improve energy levels and physical activity, Improve existing medical conditions, and Improve quality of life  Weight history: Has struggled since second pregnancy in late 38s. She has never done an organized weight loss program.  Some associated conditions: Hypertension, Hyperlipidemia, and Diabetes  Contributing factors: Family history, Nutritional, Reduced physical activity, Pregnancy, and Other: wine, carbs  Weight promoting medications identified: None  Current nutrition plan: None  Current level of physical activity: None  Current or previous pharmacotherapy: Metformin  Response to medication:  Still taking for diabetes, tolerates well.  Past medical history includes:   Past Medical History:  Diagnosis Date   Anxiety    Asthma    childhood; last Albuterol use elementary school.   Depression    Diabetes mellitus without complication (HCC)    Migraine    Seasonal allergies    Sickle cell trait (HCC)      Objective:     General:  Alert, oriented and  cooperative. Patient is in no acute distress.  Respiratory: Normal respiratory effort, no problems with respiration noted  Mental Status: Normal mood and affect. Normal behavior. Normal judgment and thought content.    Assessment and Plan:   1. Type 2 Diabetes Mellitus with other specified complication, without long-term current use of insulin HgbA1c is at goal. Last A1c was 6.3 on 02/04/2023 [place] Everywhere). She has never been on GLP-1 therapy. Medication(s): Metformin 500 mg once daily breakfast  Lab Results  Component Value Date   HGBA1C 6.1 (H) 07/01/2018   HGBA1C 6.1 (H) 12/29/2017   HGBA1C 5.8 (H) 06/25/2017   Lab Results  Component Value Date   LDLCALC 112 (H) 07/01/2018   CREATININE 0.83 07/01/2018   No results found for: "GFR"  Plan: Continue Metformin 500 mg once daily breakfast. Work on lifestyle interventions in conjunction with our program.  2. Obesity Treatment / Action Plan:  Will complete provided nutritional and psychosocial assessment questionnaire before the next appointment. Will be scheduled for indirect calorimetry to determine resting energy expenditure in a fasting state.  This will allow [place] to create a reduced calorie, high-protein meal plan to promote loss of fat mass while preserving muscle mass. Was counseled on pharmacotherapy and role as an adjunct in weight management.   Obesity Education Performed Today:  We discussed obesity as a disease and the importance of a more detailed evaluation of all the factors contributing to the disease.  We discussed the importance of long term lifestyle changes which include nutrition, exercise and behavioral modifications as well as the importance of customizing this to her specific health and social needs.  We discussed the benefits  of reaching a healthier weight to alleviate the symptoms of existing conditions and reduce the risks of the biomechanical, metabolic and psychological effects of  obesity.  Kendra Saunders appears to be in the action stage of change and states they are ready to start intensive lifestyle modifications and behavioral modifications.  ______________________________________________________________________________  She will be contacted by [facility] to set up initial appointment and the first follow up appointment with a physician.   The following office policies were discussed. She voiced understanding: - Patient will be considered late at 6 minutes past appointment time.   - For the first office visit, patient needs to arrive 1 hour early, fasting except for water. Patient should arrive 15 minutes early for all other visits. -  Patient will bring completed new patient paperwork to first office visit.  If not, appointment will be rescheduled.  30 minutes was spent today on this visit including the above counseling, pre-visit chart review, and post-visit documentation.  Reviewed by clinician on day of visit: allergies, medications, problem list, medical history, surgical history, family history, social history, and previous encounter notes pertinent to obesity diagnosis.    Jesse Sans, FNP

## 2023-06-16 ENCOUNTER — Encounter (INDEPENDENT_AMBULATORY_CARE_PROVIDER_SITE_OTHER): Payer: Self-pay | Admitting: Family Medicine

## 2023-06-16 ENCOUNTER — Telehealth (INDEPENDENT_AMBULATORY_CARE_PROVIDER_SITE_OTHER): Payer: BC Managed Care – PPO | Admitting: Family Medicine

## 2023-06-16 DIAGNOSIS — Z7984 Long term (current) use of oral hypoglycemic drugs: Secondary | ICD-10-CM | POA: Diagnosis not present

## 2023-06-16 DIAGNOSIS — E669 Obesity, unspecified: Secondary | ICD-10-CM | POA: Diagnosis not present

## 2023-06-16 DIAGNOSIS — E1169 Type 2 diabetes mellitus with other specified complication: Secondary | ICD-10-CM

## 2023-06-16 DIAGNOSIS — Z6831 Body mass index (BMI) 31.0-31.9, adult: Secondary | ICD-10-CM

## 2023-06-16 DIAGNOSIS — E119 Type 2 diabetes mellitus without complications: Secondary | ICD-10-CM | POA: Insufficient documentation

## 2023-06-16 DIAGNOSIS — Z6830 Body mass index (BMI) 30.0-30.9, adult: Secondary | ICD-10-CM | POA: Insufficient documentation

## 2023-06-16 DIAGNOSIS — R7303 Prediabetes: Secondary | ICD-10-CM

## 2023-06-23 DIAGNOSIS — Z0289 Encounter for other administrative examinations: Secondary | ICD-10-CM

## 2023-06-24 ENCOUNTER — Other Ambulatory Visit: Payer: Self-pay | Admitting: Podiatry

## 2023-06-24 DIAGNOSIS — M2041 Other hammer toe(s) (acquired), right foot: Secondary | ICD-10-CM | POA: Diagnosis not present

## 2023-06-24 MED ORDER — OXYCODONE-ACETAMINOPHEN 5-325 MG PO TABS: 1.0000 | ORAL_TABLET | ORAL | 0 refills | Status: AC | PRN

## 2023-06-24 MED ORDER — ONDANSETRON HCL 4 MG PO TABS: 4.0000 mg | ORAL_TABLET | Freq: Three times a day (TID) | ORAL | 0 refills | Status: DC | PRN

## 2023-06-30 ENCOUNTER — Encounter: Payer: Self-pay | Admitting: Podiatry

## 2023-06-30 ENCOUNTER — Ambulatory Visit (INDEPENDENT_AMBULATORY_CARE_PROVIDER_SITE_OTHER): Payer: BC Managed Care – PPO | Admitting: Podiatry

## 2023-06-30 ENCOUNTER — Ambulatory Visit (INDEPENDENT_AMBULATORY_CARE_PROVIDER_SITE_OTHER): Payer: BC Managed Care – PPO

## 2023-06-30 DIAGNOSIS — Z9889 Other specified postprocedural states: Secondary | ICD-10-CM

## 2023-06-30 NOTE — Progress Notes (Signed)
  Subjective:  Patient ID: Kendra Saunders, female    DOB: 1966/06/21,  MRN: [id number]  No chief complaint on file.   DOS: 06/24/23 Procedure: Right fifth digit de-rotational arthroplasty   57 y.o. female returns for POV#1. Doing well and managing pain.   Review of Systems: Negative except as noted in the HPI. Denies N/V/F/Ch.  Past Medical History:  Diagnosis Date   Anxiety    Asthma    childhood; last Albuterol use elementary school.   Depression    Diabetes mellitus without complication (HCC)    Migraine    Seasonal allergies    Sickle cell trait (HCC)     Current Outpatient Medications:    ondansetron (ZOFRAN) 4 MG tablet, Take 1 tablet (4 mg total) by mouth every 8 (eight) hours as needed for nausea or vomiting., Disp: 20 tablet, Rfl: 0   FLUoxetine (PROZAC) 20 MG tablet, TAKE 2 TABLETS(40 MG) BY MOUTH DAILY, Disp: 60 tablet, Rfl: 5   metFORMIN (GLUCOPHAGE) 500 MG tablet, Take 500 mg by mouth daily with breakfast., Disp: , Rfl:    rizatriptan (MAXALT-MLT) 10 MG disintegrating tablet, Take 1 tablet (10 mg total) by mouth as needed for migraine. May repeat in 2 hours if needed, Disp: 8 tablet, Rfl: 5  Current Facility-Administered Medications:    0.9 %  sodium chloride infusion, 500 mL, Intravenous, Continuous, Nandigam, Kavitha V, MD  Social History   Tobacco Use  Smoking Status Never  Smokeless Tobacco Never    Allergies  Allergen Reactions   Peanuts [Peanut Oil]     Throat itches and she has stomach pains   Objective:  There were no vitals filed for this visit. There is no height or weight on file to calculate BMI. Constitutional Well developed. Well nourished.  Vascular Foot warm and well perfused. Capillary refill normal to all digits.   Neurologic Normal speech. Oriented to person, place, and time. Epicritic sensation to light touch grossly present bilaterally.  Dermatologic Skin healing well without signs of infection. Skin edges well coapted without  signs of infection.  Orthopedic: Tenderness to palpation noted about the surgical site.   Radiographs: Toe in good alignment Assessment:  No diagnosis found. Plan:  Patient was evaluated and treated and all questions answered.  S/p foot surgery right -Progressing as expected post-operatively. -WB Status: WBAT in surgical shoe  -Sutures: intact. -Medications: n/a -Foot redressed.  Follow-up in 2 weeks for suture removal   No follow-ups on file.

## 2023-07-08 ENCOUNTER — Encounter (INDEPENDENT_AMBULATORY_CARE_PROVIDER_SITE_OTHER): Payer: Self-pay | Admitting: Internal Medicine

## 2023-07-08 ENCOUNTER — Ambulatory Visit (INDEPENDENT_AMBULATORY_CARE_PROVIDER_SITE_OTHER): Payer: BC Managed Care – PPO | Admitting: Internal Medicine

## 2023-07-08 VITALS — BP 107/72 | HR 70 | Temp 97.9°F | Ht 69.0 in | Wt 208.0 lb

## 2023-07-08 DIAGNOSIS — R0602 Shortness of breath: Secondary | ICD-10-CM | POA: Diagnosis not present

## 2023-07-08 DIAGNOSIS — E1169 Type 2 diabetes mellitus with other specified complication: Secondary | ICD-10-CM | POA: Insufficient documentation

## 2023-07-08 DIAGNOSIS — Z6830 Body mass index (BMI) 30.0-30.9, adult: Secondary | ICD-10-CM

## 2023-07-08 DIAGNOSIS — E7800 Pure hypercholesterolemia, unspecified: Secondary | ICD-10-CM

## 2023-07-08 DIAGNOSIS — R5383 Other fatigue: Secondary | ICD-10-CM

## 2023-07-08 DIAGNOSIS — E669 Obesity, unspecified: Secondary | ICD-10-CM

## 2023-07-08 DIAGNOSIS — Z1331 Encounter for screening for depression: Secondary | ICD-10-CM

## 2023-07-08 DIAGNOSIS — Z7984 Long term (current) use of oral hypoglycemic drugs: Secondary | ICD-10-CM

## 2023-07-08 DIAGNOSIS — Z723 Lack of physical exercise: Secondary | ICD-10-CM | POA: Insufficient documentation

## 2023-07-08 NOTE — Assessment & Plan Note (Signed)
We are checking disease monitoring labs today.  She is currently on metformin once daily without any adverse effects.  She may benefit from increasing medication.  We are also checking fasting blood glucose and insulin levels.  Her blood pressure is well-controlled.  I checked in care everywhere and I saw that her LDL cholesterol was in the 160s.  She is now on rosuvastatin so we will check a fasting lipid profile I would like to see her LDL cholesterol less than 70 for cardiovascular risk reduction.

## 2023-07-08 NOTE — Progress Notes (Unsigned)
Chief Complaint:   OBESITY Kendra Saunders (MR# [id number]) is a 57 y.o. female who presents for evaluation and treatment of obesity and related comorbidities. Current BMI is Body mass index is 30.72 kg/m. Kendra Saunders has been struggling with her weight for many years and has been unsuccessful in either losing weight, maintaining weight loss, or reaching her healthy weight goal.  Kendra Saunders is currently in the action stage of change and ready to dedicate time achieving and maintaining a healthier weight. Kendra Saunders is interested in becoming our patient and working on intensive lifestyle modifications including (but not limited to) diet and exercise for weight loss.  Kendra Saunders's habits were reviewed today and are as follows: Her family eats meals together, she thinks her family will eat healthier with her, her desired weight loss is 23 lbs, she started gaining weight after her second pregnancy, her heaviest weight ever was 218 pounds, she has significant food cravings issues, she snacks frequently in the evenings, she skips meals frequently, she is frequently drinking liquids with calories, she frequently makes poor food choices, she frequently eats larger portions than normal, and she struggles with emotional eating.  Depression Screen Caterra's Food and Mood (modified PHQ-9) score was 5.   Subjective:   1. Other fatigue Kendra Saunders admits to daytime somnolence and admits to waking up still tired. Patient has a history of symptoms of daytime fatigue and morning fatigue. Kendra Saunders generally gets 5 or 7 hours of sleep per night, and states that she has nightime awakenings and generally restful sleep. Snoring is present. Apneic episodes are not present. Epworth Sleepiness Score is 7.   2. SOB (shortness of breath) on exertion Kendra Saunders notes increasing shortness of breath with exercising and seems to be worsening over time with weight gain. She notes getting out of breath sooner with activity than she used to. This  has not gotten worse recently. Kendra Saunders denies shortness of breath at rest or orthopnea.  3. Type 2 diabetes mellitus with other specified complication, without long-term current use of insulin (HCC) We are checking disease monitoring labs today.  She is currently on metformin once daily without any adverse effects.  She may benefit from increasing medication.  4. Type 2 diabetes mellitus with hypercholesterolemia (HCC) She had an LDL between 150 and 166 to 9 months ago she is now on rosuvastatin.  5. Physically inactive We discussed the health risk associated with physical inactivity.   Assessment/Plan:   1. Other fatigue Kendra Saunders does feel that her weight is causing her energy to be lower than it should be. Fatigue may be related to obesity, depression or many other causes. Labs will be ordered, and in the meanwhile, Kendra Saunders will focus on self care including making healthy food choices, increasing physical activity and focusing on stress reduction.  - EKG 12-Lead - VITAMIN D 25 Hydroxy (Vit-D Deficiency, Fractures)  2. SOB (shortness of breath) on exertion Ashlye does feel that she gets out of breath more easily that she used to when she exercises. Kendra Saunders's shortness of breath appears to be obesity related and exercise induced. She has agreed to work on weight loss and gradually increase exercise to treat her exercise induced shortness of breath. Will continue to monitor closely.  3. Type 2 diabetes mellitus with other specified complication, without long-term current use of insulin (HCC) We are also checking fasting blood glucose and insulin levels.  Her blood pressure is well-controlled.  I checked in care everywhere and I saw that her LDL cholesterol was  in the 160s.  She is now on rosuvastatin so we will check a fasting lipid profile I would like to see her LDL cholesterol less than 70 for cardiovascular risk reduction.  - Vitamin B12 - Comprehensive metabolic panel - Hemoglobin  A1c - Insulin, random  4. Type 2 diabetes mellitus with hypercholesterolemia (HCC) We will check fasting lipid profile today.  Continue statin therapy at current dose.  - Lipid Panel With LDL/HDL Ratio  5. Physically inactive We will provide her with exercise counseling at her next visit.  6. Depression screen Kendra Saunders had a positive depression screening. Depression is commonly associated with obesity and often results in emotional eating behaviors. We will monitor this closely and work on CBT to help improve the non-hunger eating patterns. Referral to Psychology may be required if no improvement is seen as she continues in our clinic.  7. Class 1 obesity with serious comorbidity and body mass index (BMI) of 30.0 to 30.9 in adult, unspecified obesity type Kendra Saunders is currently in the action stage of change and her goal is to continue with weight loss efforts. I recommend Kendra Saunders begin the structured treatment plan as follows:  She has agreed to the Category 2 Plan.  Exercise goals: All adults should avoid inactivity. Some physical activity is better than none, and adults who participate in any amount of physical activity gain some health benefits.   Behavioral modification strategies: increasing lean protein intake, decreasing simple carbohydrates, increasing vegetables, increasing water intake, increasing high fiber foods, no skipping meals, meal planning and cooking strategies, better snacking choices, and planning for success.  She was informed of the importance of frequent follow-up visits to maximize her success with intensive lifestyle modifications for her multiple health conditions. She was informed we would discuss her lab results at her next visit unless there is a critical issue that needs to be addressed sooner. Kendra Saunders agreed to keep her next visit at the agreed upon time to discuss these results.  Objective:   Blood pressure 107/72, pulse 70, temperature 97.9 F (36.6 C),  height 5\' 9"  (1.753 m), weight 208 lb (94.3 kg), last menstrual period 05/08/2017, SpO2 100%. Body mass index is 30.72 kg/m.  EKG: Normal sinus rhythm, rate (unable to obtain).  Indirect Calorimeter completed today shows a VO2 of 218 and a REE of 1498.  Her calculated basal metabolic rate is (unable to obtain) thus her basal metabolic rate is  (unable to determine)  than expected.  General: Cooperative, alert, well developed, in no acute distress. HEENT: Conjunctivae and lids unremarkable. Cardiovascular: Regular rhythm.  Lungs: Normal work of breathing. Neurologic: No focal deficits.   Lab Results  Component Value Date   CREATININE 0.94 07/08/2023   BUN 10 07/08/2023   NA 140 07/08/2023   K 4.7 07/08/2023   CL 102 07/08/2023   CO2 22 07/08/2023   Lab Results  Component Value Date   ALT 20 07/08/2023   AST 18 07/08/2023   ALKPHOS 53 07/08/2023   BILITOT 0.4 07/08/2023   Lab Results  Component Value Date   HGBA1C 6.3 (H) 07/08/2023   HGBA1C 6.1 (H) 07/01/2018   HGBA1C 6.1 (H) 12/29/2017   HGBA1C 5.8 (H) 06/25/2017   HGBA1C 6.1 (H) 12/03/2016   Lab Results  Component Value Date   INSULIN 18.6 07/08/2023   Lab Results  Component Value Date   TSH 1.960 12/29/2017   Lab Results  Component Value Date   CHOL 138 07/08/2023   HDL 36 (L) 07/08/2023  LDLCALC 79 07/08/2023   TRIG 131 07/08/2023   CHOLHDL 6.5 (H) 07/01/2018   Lab Results  Component Value Date   WBC 5.8 07/01/2018   HGB 13.5 07/01/2018   HCT 41.1 07/01/2018   MCV 84 07/01/2018   PLT 281 07/01/2018   Lab Results  Component Value Date   IRON 118 06/13/2014   TIBC 352 06/13/2014   Attestation Statements:   Reviewed by clinician on day of visit: allergies, medications, problem list, medical history, surgical history, family history, social history, and previous encounter notes.  Time spent on visit including pre-visit chart review and post-visit charting and care was 40 minutes.   Trude Mcburney, am acting as transcriptionist for Worthy Rancher, MD.  I have reviewed the above documentation for accuracy and completeness, and I agree with the above. -Worthy Rancher, MD

## 2023-07-08 NOTE — Assessment & Plan Note (Signed)
We discussed the health risk associated with physical inactivity.  We will provide her with exercise counseling at her next visit.

## 2023-07-08 NOTE — Assessment & Plan Note (Signed)
She had an LDL between 150 and 166 to 9 months ago she is now on rosuvastatin.  We will check fasting lipid profile today.  Continue statin therapy at current dose.

## 2023-07-14 ENCOUNTER — Encounter: Payer: Self-pay | Admitting: Podiatry

## 2023-07-14 ENCOUNTER — Ambulatory Visit (INDEPENDENT_AMBULATORY_CARE_PROVIDER_SITE_OTHER): Payer: BC Managed Care – PPO | Admitting: Podiatry

## 2023-07-14 DIAGNOSIS — Z9889 Other specified postprocedural states: Secondary | ICD-10-CM

## 2023-07-14 NOTE — Progress Notes (Signed)
  Subjective:  Patient ID: Kendra Saunders, female    DOB: 1966/08/20,  MRN: [id number]  No chief complaint on file.   DOS: 06/24/23 Procedure: Right fifth digit de-rotational arthroplasty   57 y.o. female returns for POV#2. Doing well and managing pain.   Review of Systems: Negative except as noted in the HPI. Denies N/V/F/Ch.  Past Medical History:  Diagnosis Date   Anxiety    Asthma    childhood; last Albuterol use elementary school.   Depression    Diabetes mellitus without complication (HCC)    High cholesterol    Hypertension    Migraine    Mitral valve prolapse    Seasonal allergies    Sickle cell trait (HCC)     Current Outpatient Medications:    ondansetron (ZOFRAN) 4 MG tablet, Take 1 tablet (4 mg total) by mouth every 8 (eight) hours as needed for nausea or vomiting. (Patient not taking: Reported on 07/08/2023), Disp: 20 tablet, Rfl: 0   FLUoxetine (PROZAC) 20 MG tablet, TAKE 2 TABLETS(40 MG) BY MOUTH DAILY, Disp: 60 tablet, Rfl: 5   metFORMIN (GLUCOPHAGE) 500 MG tablet, Take 500 mg by mouth daily with breakfast., Disp: , Rfl:    olmesartan (BENICAR) 20 MG tablet, Take 20 mg by mouth daily., Disp: , Rfl:    rizatriptan (MAXALT-MLT) 10 MG disintegrating tablet, Take 1 tablet (10 mg total) by mouth as needed for migraine. May repeat in 2 hours if needed, Disp: 8 tablet, Rfl: 5   rosuvastatin (CRESTOR) 10 MG tablet, Take 10 mg by mouth daily., Disp: , Rfl:   Current Facility-Administered Medications:    0.9 %  sodium chloride infusion, 500 mL, Intravenous, Continuous, Nandigam, Kavitha V, MD  Social History   Tobacco Use  Smoking Status Never  Smokeless Tobacco Never    Allergies  Allergen Reactions   Peanuts [Peanut Oil]     Throat itches and she has stomach pains   Objective:  There were no vitals filed for this visit. There is no height or weight on file to calculate BMI. Constitutional Well developed. Well nourished.  Vascular Foot warm and well  perfused. Capillary refill normal to all digits.   Neurologic Normal speech. Oriented to person, place, and time. Epicritic sensation to light touch grossly present bilaterally.  Dermatologic Skin healing well without signs of infection. Skin edges well coapted without signs of infection.  Orthopedic: Tenderness to palpation noted about the surgical site.   Radiographs: Toe in good alignment Assessment:   1. Post-operative state    Plan:  Patient was evaluated and treated and all questions answered.  S/p foot surgery right -Progressing as expected post-operatively. -WB Status: WBAT in  regular shoe  -Sutures: removed today without incident  -Medications: n/a -Foot redressed.  Follow-up in 3 weeks for recheck  Return in about 3 weeks (around 08/04/2023) for post op.

## 2023-07-22 ENCOUNTER — Ambulatory Visit (INDEPENDENT_AMBULATORY_CARE_PROVIDER_SITE_OTHER): Payer: BC Managed Care – PPO | Admitting: Internal Medicine

## 2023-07-22 ENCOUNTER — Encounter (INDEPENDENT_AMBULATORY_CARE_PROVIDER_SITE_OTHER): Payer: Self-pay | Admitting: Internal Medicine

## 2023-07-22 VITALS — BP 114/72 | HR 73 | Temp 98.2°F | Ht 69.0 in | Wt 208.0 lb

## 2023-07-22 DIAGNOSIS — E669 Obesity, unspecified: Secondary | ICD-10-CM

## 2023-07-22 DIAGNOSIS — E668 Other obesity: Secondary | ICD-10-CM | POA: Diagnosis not present

## 2023-07-22 DIAGNOSIS — Z7984 Long term (current) use of oral hypoglycemic drugs: Secondary | ICD-10-CM

## 2023-07-22 DIAGNOSIS — Z6830 Body mass index (BMI) 30.0-30.9, adult: Secondary | ICD-10-CM | POA: Diagnosis not present

## 2023-07-22 DIAGNOSIS — E7800 Pure hypercholesterolemia, unspecified: Secondary | ICD-10-CM | POA: Diagnosis not present

## 2023-07-22 DIAGNOSIS — E1169 Type 2 diabetes mellitus with other specified complication: Secondary | ICD-10-CM | POA: Diagnosis not present

## 2023-07-22 NOTE — Assessment & Plan Note (Signed)
Her most recent LDL is 12 which is improved from 160 in Care Everywhere.  She is on rosuvastatin 10 mg and recommend continuing current dose.  She is not having any side effects.

## 2023-07-22 NOTE — Assessment & Plan Note (Signed)
See obesity management plan

## 2023-07-22 NOTE — Assessment & Plan Note (Addendum)
Her most recent A1c is 6.3.  She is currently on metformin 500 mg once a day and is not interested in increasing medication even after review of benefits and side effects of drug.  Her B12 levels are normal so medication not affecting B12 levels.  She again states that she wants to lose weight to be able to come off medications.  Her blood pressure is well-controlled on olmesartan with normal renal parameters.  Her LDL cholesterol has improved significantly on rosuvastatin.  She will work with primary care team to stay updated on diabetes preventive measures.  Also has hyperinsulinemia part of the spectrum, and benefits from a low-carb meal plan.  She will continue with nutritional and behavioral strategies.  Continue with metformin.  She may be a candidate for incretin therapy to assist with weight management.

## 2023-07-22 NOTE — Progress Notes (Signed)
Office: [phone]  /  Fax: [phone]  WEIGHT SUMMARY AND BIOMETRICS  Vitals Temp: 98.2 F (36.8 C) BP: 114/72 Pulse Rate: 73 SpO2: 100 %   Anthropometric Measurements Height: 5\' 9"  (1.753 m) Weight: 208 lb (94.3 kg) BMI (Calculated): 30.7 Weight Lost Since Last Visit: 0 lb Weight Gained Since Last Visit: 0 lb Starting Weight: 208 lb Total Weight Loss (lbs): 0 lb (0 kg) Peak Weight: 217 lb   Body Composition  Body Fat %: 31.9 % Fat Mass (lbs): 66.6 lbs Muscle Mass (lbs): 135 lbs Total Body Water (lbs): 86.2 lbs Visceral Fat Rating : 8    RMR: 1498  Today's Visit #: 2  Starting Date: 07/08/23   HPI  Chief Complaint: OBESITY  Kendra Saunders is here to discuss her progress with her obesity treatment plan. She is on the the Category 2 Plan and states she is following her eating plan approximately 50 % of the time. She states she is not exercising.  Interval History:  Since last office visit she has maintained. She reports working on implementation of reduced calorie nutritional plan She has been working on increasing protein intake at every meal, avoiding and / or reducing liquid calories, and avoiding or reducing simple and processed carbohydrates.  She is asking for assistance regarding reading food labels and calculating calories.  Orixegenic Control: Denies problems with appetite and hunger signals.  Denies problems with satiety and satiation.  Denies problems with eating patterns and portion control.  Reports abnormal cravings for sweets but has been improving as she has decreased simple sugars. Denies feeling deprived or restricted.   Barriers identified: having difficulty preparing healthy meals and having difficulty with meal prep and planning.   Pharmacotherapy for weight loss: She is currently taking no anti-obesity medication.    ASSESSMENT AND PLAN  TREATMENT PLAN FOR OBESITY:  Recommended Dietary Goals  Kendra Saunders is currently in the action  stage of change. As such, her goal is to continue weight management plan. She has agreed to: continue to work on implementation of reduced calorie nutrition plan (RCNP)  Behavioral Intervention  We discussed the following Behavioral Modification Strategies today: increasing lean protein intake, decreasing simple carbohydrates , increasing vegetables, increasing lower glycemic fruits, increasing fiber rich foods, increasing water intake, work on meal planning and preparation, work on Counselling psychologist calories using tracking application, reading food labels , continue to work on implementation of reduced calorie nutritional plan, and planning for success.  Additional resources provided today: Handout and personalized instruction on tracking and journaling using Apps, Personalized instruction on the use of YUKA app to quickly assess nutritional value of prepackaged foods., and food options for lunch and breakfast.  I also created her a 5-day eating plan using AI targeting 1200 cal and 100 g of protein per day.  Recommended Physical Activity Goals  Kendra Saunders has been advised to work up to 150 minutes of moderate intensity aerobic activity a week and strengthening exercises 2-3 times per week for cardiovascular health, weight loss maintenance and preservation of muscle mass.   She has agreed to :  Think about ways to increase daily physical activity and overcoming barriers to exercise and Increase physical activity in their day and reduce sedentary time (increase NEAT).  Pharmacotherapy We discussed various medication options to help Kendra Saunders with her weight loss efforts and we both agreed to : continue with nutritional and behavioral strategies  ASSOCIATED CONDITIONS ADDRESSED TODAY  Type 2 diabetes mellitus with other specified complication, without long-term current  use of insulin (HCC) Assessment & Plan: Her most recent A1c is 6.3.  She is currently on metformin 500 mg once a day and is not  interested in increasing medication even after review of benefits and side effects of drug.  Her B12 levels are normal so medication not affecting B12 levels.  She again states that she wants to lose weight to be able to come off medications.  Her blood pressure is well-controlled on olmesartan with normal renal parameters.  Her LDL cholesterol has improved significantly on rosuvastatin.  She will work with primary care team to stay updated on diabetes preventive measures.  She will continue with nutritional and behavioral strategies.  Continue with metformin.  She may be a candidate for incretin therapy to assist with weight management.   Class 1 obesity with serious comorbidity and body mass index (BMI) of 30.0 to 30.9 in adult, unspecified obesity type Assessment & Plan: See obesity management plan   Type 2 diabetes mellitus with hypercholesterolemia (HCC) Assessment & Plan: Her most recent LDL is 79 which is improved from 160 in Care Everywhere.  She is on rosuvastatin 10 mg and recommend continuing current dose.  She is not having any side effects.     PHYSICAL EXAM:  Blood pressure 114/72, pulse 73, temperature 98.2 F (36.8 C), height 5\' 9"  (1.753 m), weight 208 lb (94.3 kg), last menstrual period 05/08/2017, SpO2 100%. Body mass index is 30.72 kg/m.  General: She is overweight, cooperative, alert, well developed, and in no acute distress. PSYCH: Has normal mood, affect and thought process.   HEENT: EOMI, sclerae are anicteric. Lungs: Normal breathing effort, no conversational dyspnea. Extremities: No edema.  Neurologic: No gross sensory or motor deficits. No tremors or fasciculations noted.    DIAGNOSTIC DATA REVIEWED:  BMET    Component Value Date/Time   NA 140 07/08/2023 0857   K 4.7 07/08/2023 0857   CL 102 07/08/2023 0857   CO2 22 07/08/2023 0857   GLUCOSE 87 07/08/2023 0857   GLUCOSE 90 06/25/2016 1229   BUN 10 07/08/2023 0857   CREATININE 0.94 07/08/2023 0857    CREATININE 0.87 06/25/2016 1229   CALCIUM 9.9 07/08/2023 0857   GFRNONAA 81 07/01/2018 0850   GFRNONAA 77 10/19/2014 0954   GFRAA 93 07/01/2018 0850   GFRAA 89 10/19/2014 0954   Lab Results  Component Value Date   HGBA1C 6.3 (H) 07/08/2023   HGBA1C 5.7 (H) 10/19/2014   Lab Results  Component Value Date   INSULIN 18.6 07/08/2023   Lab Results  Component Value Date   TSH 1.960 12/29/2017   CBC    Component Value Date/Time   WBC 5.8 07/01/2018 0850   WBC 4.1 06/25/2016 1229   RBC 4.91 07/01/2018 0850   RBC 4.64 06/25/2016 1229   HGB 13.5 07/01/2018 0850   HCT 41.1 07/01/2018 0850   PLT 281 07/01/2018 0850   MCV 84 07/01/2018 0850   MCH 27.5 07/01/2018 0850   MCH 27.6 06/25/2016 1229   MCHC 32.8 07/01/2018 0850   MCHC 34.1 06/25/2016 1229   RDW 14.8 07/01/2018 0850   Iron Studies    Component Value Date/Time   IRON 118 06/13/2014 1121   TIBC 352 06/13/2014 1121   IRONPCTSAT 34 06/13/2014 1121   Lipid Panel     Component Value Date/Time   CHOL 138 07/08/2023 0857   TRIG 131 07/08/2023 0857   TRIG 108 12/01/2006 1058   HDL 36 (L) 07/08/2023 0857   CHOLHDL 6.5 (  H) 07/01/2018 0919   CHOLHDL 5.0 10/19/2014 0954   VLDL 37 10/19/2014 0954   LDLCALC 79 07/08/2023 0857   Hepatic Function Panel     Component Value Date/Time   PROT 7.6 07/08/2023 0857   ALBUMIN 4.9 07/08/2023 0857   AST 18 07/08/2023 0857   ALT 20 07/08/2023 0857   ALKPHOS 53 07/08/2023 0857   BILITOT 0.4 07/08/2023 0857      Component Value Date/Time   TSH 1.960 12/29/2017 1814   Nutritional Lab Results  Component Value Date   VD25OH 58.2 07/08/2023   VD25OH 38.1 12/03/2016   VD25OH 28 (L) 06/25/2016     No follow-ups on file.Marland Kitchen She was informed of the importance of frequent follow up visits to maximize her success with intensive lifestyle modifications for her multiple health conditions.   ATTESTASTION STATEMENTS:  Reviewed by clinician on day of visit: allergies, medications,  problem list, medical history, surgical history, family history, social history, and previous encounter notes.   I have spent 40 minutes in the care of the patient today including: obtaining and/or reviewing separately obtained history, performing a medically appropriate examination or evaluation, counseling and educating the patient, documenting clinical information in the electronic or other health care record, and independently interpreting results and communicating results to the patient,family, or caregiver   Worthy Rancher, MD

## 2023-08-06 ENCOUNTER — Encounter: Payer: Self-pay | Admitting: Podiatry

## 2023-08-06 ENCOUNTER — Ambulatory Visit (INDEPENDENT_AMBULATORY_CARE_PROVIDER_SITE_OTHER): Payer: BC Managed Care – PPO | Admitting: Podiatry

## 2023-08-06 DIAGNOSIS — Z9889 Other specified postprocedural states: Secondary | ICD-10-CM

## 2023-08-06 NOTE — Progress Notes (Signed)
  Subjective:  Patient ID: Kendra Saunders, female    DOB: 16-Mar-1966,  MRN: [id number]  No chief complaint on file.   DOS: 06/24/23 Procedure: Right fifth digit de-rotational arthroplasty   57 y.o. female returns for POV#3. Doing well and managing pain. Relates some swelling and discomfort in certain shoes.   Review of Systems: Negative except as noted in the HPI. Denies N/V/F/Ch.  Past Medical History:  Diagnosis Date   Anxiety    Asthma    childhood; last Albuterol use elementary school.   Depression    Diabetes mellitus without complication (HCC)    High cholesterol    Hypertension    Migraine    Mitral valve prolapse    Seasonal allergies    Sickle cell trait (HCC)     Current Outpatient Medications:    FLUoxetine (PROZAC) 20 MG tablet, TAKE 2 TABLETS(40 MG) BY MOUTH DAILY, Disp: 60 tablet, Rfl: 5   metFORMIN (GLUCOPHAGE) 500 MG tablet, Take 500 mg by mouth daily with breakfast., Disp: , Rfl:    olmesartan (BENICAR) 20 MG tablet, Take 20 mg by mouth daily., Disp: , Rfl:    ondansetron (ZOFRAN) 4 MG tablet, Take 1 tablet (4 mg total) by mouth every 8 (eight) hours as needed for nausea or vomiting., Disp: 20 tablet, Rfl: 0   rizatriptan (MAXALT-MLT) 10 MG disintegrating tablet, Take 1 tablet (10 mg total) by mouth as needed for migraine. May repeat in 2 hours if needed, Disp: 8 tablet, Rfl: 5   rosuvastatin (CRESTOR) 10 MG tablet, Take 10 mg by mouth daily., Disp: , Rfl:   Current Facility-Administered Medications:    0.9 %  sodium chloride infusion, 500 mL, Intravenous, Continuous, Nandigam, Kavitha V, MD  Social History   Tobacco Use  Smoking Status Never  Smokeless Tobacco Never    Allergies  Allergen Reactions   Peanuts [Peanut Oil]     Throat itches and she has stomach pains   Objective:  There were no vitals filed for this visit. There is no height or weight on file to calculate BMI. Constitutional Well developed. Well nourished.  Vascular Foot warm and  well perfused. Capillary refill normal to all digits.   Neurologic Normal speech. Oriented to person, place, and time. Epicritic sensation to light touch grossly present bilaterally.  Dermatologic Skin healing well without signs of infection. Skin edges well coapted without signs of infection.  Orthopedic: Tenderness to palpation noted about the surgical site.   Radiographs: Toe in good alignment Assessment:   1. Post-operative state    Plan:  Patient was evaluated and treated and all questions answered.  S/p foot surgery right -Progressing as expected post-operatively. -WB Status: WBAT in  regular shoe  -Medications: n/a -Discussed wrapping with compression to help with swelling and certain shoes for now.  Discharged from surgical standpoing.   No follow-ups on file.

## 2023-08-07 ENCOUNTER — Encounter (INDEPENDENT_AMBULATORY_CARE_PROVIDER_SITE_OTHER): Payer: Self-pay | Admitting: Internal Medicine

## 2023-08-07 ENCOUNTER — Ambulatory Visit (INDEPENDENT_AMBULATORY_CARE_PROVIDER_SITE_OTHER): Payer: BC Managed Care – PPO | Admitting: Internal Medicine

## 2023-08-07 VITALS — BP 106/69 | HR 82 | Temp 98.1°F | Ht 69.0 in | Wt 204.0 lb

## 2023-08-07 DIAGNOSIS — R948 Abnormal results of function studies of other organs and systems: Secondary | ICD-10-CM

## 2023-08-07 DIAGNOSIS — E668 Other obesity: Secondary | ICD-10-CM

## 2023-08-07 DIAGNOSIS — Z6830 Body mass index (BMI) 30.0-30.9, adult: Secondary | ICD-10-CM

## 2023-08-07 DIAGNOSIS — Z7984 Long term (current) use of oral hypoglycemic drugs: Secondary | ICD-10-CM

## 2023-08-07 DIAGNOSIS — E1169 Type 2 diabetes mellitus with other specified complication: Secondary | ICD-10-CM

## 2023-08-07 DIAGNOSIS — R638 Other symptoms and signs concerning food and fluid intake: Secondary | ICD-10-CM

## 2023-08-07 DIAGNOSIS — E669 Obesity, unspecified: Secondary | ICD-10-CM

## 2023-08-07 NOTE — Assessment & Plan Note (Signed)
 See obesity management plan

## 2023-08-07 NOTE — Assessment & Plan Note (Signed)
Patient has a slower than predicted metabolism. IC 1498 vs. calculated 1867. This may contribute to weight gain, chronic fatigue and difficulty losing weight.  We reviewed measures to improve metabolism including not skipping meals, progressive strengthening exercises, increasing protein intake at every meal and maintaining adequate hydration and sleep.

## 2023-08-07 NOTE — Assessment & Plan Note (Signed)
She has increased orixegenic signaling, impaired satiety and inhibitory control. This is secondary to an abnormal energy regulation system and pathological neurohormonal pathways characteristic of excess adiposity.  In addition to nutritional and behavioral strategies she benefits from pharmacotherapy.

## 2023-08-07 NOTE — Assessment & Plan Note (Signed)
Her most recent A1c is 6.3.  She is currently on metformin 500 mg once a day.  Her blood pressure is well-controlled on olmesartan with normal renal parameters.  Her LDL cholesterol has improved significantly on rosuvastatin.  She will work with primary care team to stay updated on diabetes preventive measures.  Also has hyperinsulinemia part of the spectrum, and benefits from a low-carb meal plan.  She will continue with nutritional and behavioral strategies.  After discussion of benefits and side effects she is agreeable to increasing metformin 500 mg twice daily.  If she has side effects with immediate release this could be switch to extended release.  This is currently being managed by her primary care team.  She may also be a candidate for incretin therapy.

## 2023-08-07 NOTE — Progress Notes (Signed)
Office: [phone]  /  Fax: [phone]  WEIGHT SUMMARY AND BIOMETRICS  Vitals Temp: 98.1 F (36.7 C) BP: 106/69 Pulse Rate: 82 SpO2: 99 %   Anthropometric Measurements Height: 5\' 9"  (1.753 m) Weight: 204 lb (92.5 kg) BMI (Calculated): 30.11 Weight at Last Visit: 208 lb Weight Lost Since Last Visit: 4lb Weight Gained Since Last Visit: 0 lb Starting Weight: 208 lb Total Weight Loss (lbs): 4 lb (1.814 kg) Peak Weight: 217 lb   Body Composition  Body Fat %: 31.6 % Fat Mass (lbs): 64.4 lbs Muscle Mass (lbs): 132.6 lbs Total Body Water (lbs): 84.6 lbs Visceral Fat Rating : 8    RMR: 1498  Today's Visit #: 3  Starting Date: 07/08/23   HPI  Chief Complaint: OBESITY  Kendra Saunders is here to discuss her progress with her obesity treatment plan. Kendra Saunders is on the the Category 2 Plan and states Kendra Saunders is following her eating plan approximately 50 % of the time. Kendra Saunders states Kendra Saunders is exercising 30 minutes 4 times per week.  Interval History:  Since last office visit Kendra Saunders has lost 4 lbs. Has been using Yuka app to check foods. Has reduced sugar and processed carbs but noticing increased cravings for sweets.  Kendra Saunders reports fair adherence to reduced calorie nutritional plan. Kendra Saunders has been working on not skipping meals, increasing protein intake at every meal, avoiding or reducing simple and processed carbohydrates, and making healthier choices  Orexigenic Control: Reports problems with appetite and hunger signals.  Reports problems with satiety and satiation.  Denies problems with eating patterns and portion control.  Reports abnormal cravings for sweets. Denies feeling deprived or restricted.   Barriers identified: strong hunger signals and appetite.   Pharmacotherapy for weight loss: Kendra Saunders is currently taking Metformin (off label use for incretin effect and / or insulin resistance and / or diabetes prevention) with adequate clinical response  and without side effects..     ASSESSMENT AND PLAN  TREATMENT PLAN FOR OBESITY:  Recommended Dietary Goals  Kendra Saunders is currently in the action stage of change. As such, her goal is to continue weight management plan. Kendra Saunders has agreed to: continue current plan  Behavioral Intervention  We discussed the following Behavioral Modification Strategies today: increasing lean protein intake, decreasing simple carbohydrates , increasing vegetables, increasing lower glycemic fruits, increasing water intake, continue to practice mindfulness when eating, and planning for success.  Additional resources provided today: Handout on reducing stress and sleep hygiene and Handout on recognition of cravings vs hunger  Recommended Physical Activity Goals  Kendra Saunders has been advised to work up to 150 minutes of moderate intensity aerobic activity a week and strengthening exercises 2-3 times per week for cardiovascular health, weight loss maintenance and preservation of muscle mass.   Kendra Saunders has agreed to :  Think about ways to increase daily physical activity and overcoming barriers to exercise and Increase physical activity in their day and reduce sedentary time (increase NEAT).  Pharmacotherapy We discussed various medication options to help Kendra Saunders with her weight loss efforts and we both agreed to : continue current anti-obesity medication regimen  ASSOCIATED CONDITIONS ADDRESSED TODAY  Type 2 diabetes mellitus with other specified complication, without long-term current use of insulin (HCC) Assessment & Plan: Her most recent A1c is 6.3.  Kendra Saunders is currently on metformin 500 mg once a day.  Her blood pressure is well-controlled on olmesartan with normal renal parameters.  Her LDL cholesterol has improved significantly on rosuvastatin.  Kendra Saunders will work with primary care  team to stay updated on diabetes preventive measures.  Also has hyperinsulinemia part of the spectrum, and benefits from a low-carb meal plan.  Kendra Saunders will continue with  nutritional and behavioral strategies.  After discussion of benefits and side effects Kendra Saunders is agreeable to increasing metformin 500 mg twice daily.  If Kendra Saunders has side effects with immediate release this could be switch to extended release.  This is currently being managed by her primary care team.  Kendra Saunders may also be a candidate for incretin therapy.   Class 1 obesity with serious comorbidity and body mass index (BMI) of 30.0 to 30.9 in adult, unspecified obesity type Assessment & Plan: See obesity management plan   Abnormal food appetite Assessment & Plan: Kendra Saunders has increased orixegenic signaling, impaired satiety and inhibitory control. This is secondary to an abnormal energy regulation system and pathological neurohormonal pathways characteristic of excess adiposity.  In addition to nutritional and behavioral strategies Kendra Saunders benefits from pharmacotherapy.     Abnormal metabolism Assessment & Plan: Patient has a slower than predicted metabolism. IC 1498 vs. calculated 1867. This may contribute to weight gain, chronic fatigue and difficulty losing weight.  We reviewed measures to improve metabolism including not skipping meals, progressive strengthening exercises, increasing protein intake at every meal and maintaining adequate hydration and sleep.        PHYSICAL EXAM:  Blood pressure 106/69, pulse 82, temperature 98.1 F (36.7 C), height 5\' 9"  (1.753 m), weight 204 lb (92.5 kg), last menstrual period 05/08/2017, SpO2 99%. Body mass index is 30.13 kg/m.  General: Kendra Saunders is overweight, cooperative, alert, well developed, and in no acute distress. PSYCH: Has normal mood, affect and thought process.   HEENT: EOMI, sclerae are anicteric. Lungs: Normal breathing effort, no conversational dyspnea. Extremities: No edema.  Neurologic: No gross sensory or motor deficits. No tremors or fasciculations noted.    DIAGNOSTIC DATA REVIEWED:  BMET    Component Value Date/Time   NA 140 07/08/2023  0857   K 4.7 07/08/2023 0857   CL 102 07/08/2023 0857   CO2 22 07/08/2023 0857   GLUCOSE 87 07/08/2023 0857   GLUCOSE 90 06/25/2016 1229   BUN 10 07/08/2023 0857   CREATININE 0.94 07/08/2023 0857   CREATININE 0.87 06/25/2016 1229   CALCIUM 9.9 07/08/2023 0857   GFRNONAA 81 07/01/2018 0850   GFRNONAA 77 10/19/2014 0954   GFRAA 93 07/01/2018 0850   GFRAA 89 10/19/2014 0954   Lab Results  Component Value Date   HGBA1C 6.3 (H) 07/08/2023   HGBA1C 5.7 (H) 10/19/2014   Lab Results  Component Value Date   INSULIN 18.6 07/08/2023   Lab Results  Component Value Date   TSH 1.960 12/29/2017   CBC    Component Value Date/Time   WBC 5.8 07/01/2018 0850   WBC 4.1 06/25/2016 1229   RBC 4.91 07/01/2018 0850   RBC 4.64 06/25/2016 1229   HGB 13.5 07/01/2018 0850   HCT 41.1 07/01/2018 0850   PLT 281 07/01/2018 0850   MCV 84 07/01/2018 0850   MCH 27.5 07/01/2018 0850   MCH 27.6 06/25/2016 1229   MCHC 32.8 07/01/2018 0850   MCHC 34.1 06/25/2016 1229   RDW 14.8 07/01/2018 0850   Iron Studies    Component Value Date/Time   IRON 118 06/13/2014 1121   TIBC 352 06/13/2014 1121   IRONPCTSAT 34 06/13/2014 1121   Lipid Panel     Component Value Date/Time   CHOL 138 07/08/2023 0857   TRIG 131 07/08/2023 0857  TRIG 108 12/01/2006 1058   HDL 36 (L) 07/08/2023 0857   CHOLHDL 6.5 (H) 07/01/2018 0919   CHOLHDL 5.0 10/19/2014 0954   VLDL 37 10/19/2014 0954   LDLCALC 79 07/08/2023 0857   Hepatic Function Panel     Component Value Date/Time   PROT 7.6 07/08/2023 0857   ALBUMIN 4.9 07/08/2023 0857   AST 18 07/08/2023 0857   ALT 20 07/08/2023 0857   ALKPHOS 53 07/08/2023 0857   BILITOT 0.4 07/08/2023 0857      Component Value Date/Time   TSH 1.960 12/29/2017 1814   Nutritional Lab Results  Component Value Date   VD25OH 58.2 07/08/2023   VD25OH 38.1 12/03/2016   VD25OH 28 (L) 06/25/2016     Return for Week of Sept 30th - 5 weeks - with Dr. Rikki Spearing.Marland Kitchen Kendra Saunders was informed  of the importance of frequent follow up visits to maximize her success with intensive lifestyle modifications for her multiple health conditions.   ATTESTASTION STATEMENTS:  Reviewed by clinician on day of visit: allergies, medications, problem list, medical history, surgical history, family history, social history, and previous encounter notes.     Worthy Rancher, MD

## 2023-09-18 ENCOUNTER — Ambulatory Visit (INDEPENDENT_AMBULATORY_CARE_PROVIDER_SITE_OTHER): Payer: BC Managed Care – PPO | Admitting: Internal Medicine

## 2023-09-18 ENCOUNTER — Other Ambulatory Visit (HOSPITAL_BASED_OUTPATIENT_CLINIC_OR_DEPARTMENT_OTHER): Payer: Self-pay

## 2023-09-18 ENCOUNTER — Encounter (INDEPENDENT_AMBULATORY_CARE_PROVIDER_SITE_OTHER): Payer: Self-pay | Admitting: Internal Medicine

## 2023-09-18 VITALS — BP 103/67 | HR 76 | Temp 98.0°F | Ht 69.0 in | Wt 202.0 lb

## 2023-09-18 DIAGNOSIS — Z7985 Long-term (current) use of injectable non-insulin antidiabetic drugs: Secondary | ICD-10-CM

## 2023-09-18 DIAGNOSIS — Z6830 Body mass index (BMI) 30.0-30.9, adult: Secondary | ICD-10-CM

## 2023-09-18 DIAGNOSIS — E66811 Obesity, class 1: Secondary | ICD-10-CM | POA: Diagnosis not present

## 2023-09-18 DIAGNOSIS — R638 Other symptoms and signs concerning food and fluid intake: Secondary | ICD-10-CM

## 2023-09-18 DIAGNOSIS — E1169 Type 2 diabetes mellitus with other specified complication: Secondary | ICD-10-CM

## 2023-09-18 DIAGNOSIS — R948 Abnormal results of function studies of other organs and systems: Secondary | ICD-10-CM

## 2023-09-18 MED ORDER — TIRZEPATIDE 2.5 MG/0.5ML ~~LOC~~ SOAJ
2.5000 mg | SUBCUTANEOUS | 0 refills | Status: DC
Start: 2023-09-18 — End: 2023-10-09
  Filled 2023-09-18: qty 2, 28d supply, fill #0

## 2023-09-18 NOTE — Assessment & Plan Note (Signed)
 See obesity treatment plan

## 2023-09-18 NOTE — Assessment & Plan Note (Signed)
She has increased orixegenic signaling, impaired satiety and inhibitory control. This is secondary to an abnormal energy regulation system and pathological neurohormonal pathways characteristic of excess adiposity.  In addition to nutritional and behavioral strategies she benefits from pharmacotherapy.  She will be started on Mounjaro 2.5 mg once a week with a primary indication of type 2 diabetes

## 2023-09-18 NOTE — Assessment & Plan Note (Signed)
Patient has a slower than predicted metabolism. IC 1498 vs. calculated 1867. This may contribute to weight gain, chronic fatigue and difficulty losing weight.  We reviewed measures to improve metabolism including not skipping meals, progressive strengthening exercises, increasing protein intake at every meal and maintaining adequate hydration and sleep.

## 2023-09-18 NOTE — Assessment & Plan Note (Signed)
Her most recent A1c is 6.3.  She is currently on metformin 500 mg once a day.  Patient benefits from incretin therapy as she is affected by obesity and has multiple obesity related conditions.  She also has strong orixegenic signaling.  After discussion of benefits and side effects and instructions on how to use pen device she will be started on Mounjaro 2.5 mg once a week.

## 2023-09-18 NOTE — Progress Notes (Signed)
Office: [phone]  /  Fax: [phone]  WEIGHT SUMMARY AND BIOMETRICS  Vitals Temp: 98 F (36.7 C) BP: 103/67 Pulse Rate: 76 SpO2: 100 %   Anthropometric Measurements Height: 5\' 9"  (1.753 m) Weight: 202 lb (91.6 kg) BMI (Calculated): 29.82 Weight at Last Visit: 204 lb Weight Lost Since Last Visit: 2 lb Weight Gained Since Last Visit: 0 Starting Weight: 208 b Total Weight Loss (lbs): 6 lb (2.722 kg) Peak Weight: 217 lb   Body Composition  Body Fat %: 28 % Fat Mass (lbs): 56.6 lbs Muscle Mass (lbs): 138.2 lbs Total Body Water (lbs): 83.2 lbs Visceral Fat Rating : 7    RMR: 1498  Today's Visit #: 4  Starting Date: 07/08/23   HPI  Chief Complaint: OBESITY  Kendra Saunders is here to discuss her progress with her obesity treatment plan. She is on the the Category 2 Plan and states she is following her eating plan approximately 50 % of the time. She states she is getting 6,000 steps 5 times per week.  Interval History:  Since last office visit she has lost 2 pounds. She reports good adherence to reduced calorie nutritional plan. She has been working on reading food labels, not skipping meals, increasing protein intake at every meal, drinking more water, making healthier choices, reducing portion sizes, and incorporating more whole foods  Orexigenic Control: Reports problems with appetite and hunger signals.  Reports problems with satiety and satiation.  Denies problems with eating patterns and portion control.  Denies abnormal cravings. Denies feeling deprived or restricted.   Barriers identified: strong hunger signals and impaired satiety / inhibitory control.   Pharmacotherapy for weight loss: She is currently taking Metformin (off label use for incretin effect and / or insulin resistance and / or diabetes prevention) with adequate clinical response  and without side effects..    ASSESSMENT AND PLAN  TREATMENT PLAN FOR OBESITY:  Recommended Dietary  Goals  Kendra Saunders is currently in the action stage of change. As such, her goal is to continue weight management plan. She has agreed to: continue current plan  Behavioral Intervention  We discussed the following Behavioral Modification Strategies today: continue to work on maintaining a reduced calorie state, getting the recommended amount of protein, incorporating whole foods, making healthy choices, staying well hydrated and practicing mindfulness when eating..  Additional resources provided today: None  Recommended Physical Activity Goals  Kendra Saunders has been advised to work up to 150 minutes of moderate intensity aerobic activity a week and strengthening exercises 2-3 times per week for cardiovascular health, weight loss maintenance and preservation of muscle mass.   She has agreed to :  Think about enjoyable ways to increase daily physical activity and overcoming barriers to exercise and Increase physical activity in their day and reduce sedentary time (increase NEAT).  Pharmacotherapy We discussed various medication options to help Kendra Saunders with her weight loss efforts and we both agreed to : Patient will be started on tirzepatide 2.5 mg once a week for diabetes and weight management.  ASSOCIATED CONDITIONS ADDRESSED TODAY  Type 2 diabetes mellitus with other specified complication, without long-term current use of insulin (HCC) Assessment & Plan: Her most recent A1c is 6.3.  She is currently on metformin 500 mg once a day.  Patient benefits from incretin therapy as she is affected by obesity and has multiple obesity related conditions.  She also has strong orixegenic signaling.  After discussion of benefits and side effects and instructions on how to use pen device  she will be started on Mounjaro 2.5 mg once a week.  Orders: -     Tirzepatide; Inject 2.5 mg into the skin once a week.  Dispense: 2 mL; Refill: 0  Class 1 obesity with serious comorbidity and body mass index (BMI) of 30.0  to 30.9 in adult, unspecified obesity type Assessment & Plan: See obesity treatment plan   Abnormal food appetite Assessment & Plan: She has increased orixegenic signaling, impaired satiety and inhibitory control. This is secondary to an abnormal energy regulation system and pathological neurohormonal pathways characteristic of excess adiposity.  In addition to nutritional and behavioral strategies she benefits from pharmacotherapy.  She will be started on Mounjaro 2.5 mg once a week with a primary indication of type 2 diabetes    Abnormal metabolism Assessment & Plan: Patient has a slower than predicted metabolism. IC 1498 vs. calculated 1867. This may contribute to weight gain, chronic fatigue and difficulty losing weight.  We reviewed measures to improve metabolism including not skipping meals, progressive strengthening exercises, increasing protein intake at every meal and maintaining adequate hydration and sleep.        PHYSICAL EXAM:  Blood pressure 103/67, pulse 76, temperature 98 F (36.7 C), height 5\' 9"  (1.753 m), weight 202 lb (91.6 kg), last menstrual period 05/08/2017, SpO2 100%. Body mass index is 29.83 kg/m.  General: She is overweight, cooperative, alert, well developed, and in no acute distress. PSYCH: Has normal mood, affect and thought process.   HEENT: EOMI, sclerae are anicteric. Lungs: Normal breathing effort, no conversational dyspnea. Extremities: No edema.  Neurologic: No gross sensory or motor deficits. No tremors or fasciculations noted.    DIAGNOSTIC DATA REVIEWED:  BMET    Component Value Date/Time   NA 140 07/08/2023 0857   K 4.7 07/08/2023 0857   CL 102 07/08/2023 0857   CO2 22 07/08/2023 0857   GLUCOSE 87 07/08/2023 0857   GLUCOSE 90 06/25/2016 1229   BUN 10 07/08/2023 0857   CREATININE 0.94 07/08/2023 0857   CREATININE 0.87 06/25/2016 1229   CALCIUM 9.9 07/08/2023 0857   GFRNONAA 81 07/01/2018 0850   GFRNONAA 77 10/19/2014 0954    GFRAA 93 07/01/2018 0850   GFRAA 89 10/19/2014 0954   Lab Results  Component Value Date   HGBA1C 6.3 (H) 07/08/2023   HGBA1C 5.7 (H) 10/19/2014   Lab Results  Component Value Date   INSULIN 18.6 07/08/2023   Lab Results  Component Value Date   TSH 1.960 12/29/2017   CBC    Component Value Date/Time   WBC 5.8 07/01/2018 0850   WBC 4.1 06/25/2016 1229   RBC 4.91 07/01/2018 0850   RBC 4.64 06/25/2016 1229   HGB 13.5 07/01/2018 0850   HCT 41.1 07/01/2018 0850   PLT 281 07/01/2018 0850   MCV 84 07/01/2018 0850   MCH 27.5 07/01/2018 0850   MCH 27.6 06/25/2016 1229   MCHC 32.8 07/01/2018 0850   MCHC 34.1 06/25/2016 1229   RDW 14.8 07/01/2018 0850   Iron Studies    Component Value Date/Time   IRON 118 06/13/2014 1121   TIBC 352 06/13/2014 1121   IRONPCTSAT 34 06/13/2014 1121   Lipid Panel     Component Value Date/Time   CHOL 138 07/08/2023 0857   TRIG 131 07/08/2023 0857   TRIG 108 12/01/2006 1058   HDL 36 (L) 07/08/2023 0857   CHOLHDL 6.5 (H) 07/01/2018 0919   CHOLHDL 5.0 10/19/2014 0954   VLDL 37 10/19/2014 0954   LDLCALC  79 07/08/2023 0857   Hepatic Function Panel     Component Value Date/Time   PROT 7.6 07/08/2023 0857   ALBUMIN 4.9 07/08/2023 0857   AST 18 07/08/2023 0857   ALT 20 07/08/2023 0857   ALKPHOS 53 07/08/2023 0857   BILITOT 0.4 07/08/2023 0857      Component Value Date/Time   TSH 1.960 12/29/2017 1814   Nutritional Lab Results  Component Value Date   VD25OH 58.2 07/08/2023   VD25OH 38.1 12/03/2016   VD25OH 28 (L) 06/25/2016     Return in about 3 weeks (around 10/09/2023) for For Weight Mangement with Dr. Rikki Spearing.Marland Kitchen She was informed of the importance of frequent follow up visits to maximize her success with intensive lifestyle modifications for her multiple health conditions.   ATTESTASTION STATEMENTS:  Reviewed by clinician on day of visit: allergies, medications, problem list, medical history, surgical history, family history,  social history, and previous encounter notes.     Worthy Rancher, MD

## 2023-10-09 ENCOUNTER — Encounter (INDEPENDENT_AMBULATORY_CARE_PROVIDER_SITE_OTHER): Payer: Self-pay | Admitting: Internal Medicine

## 2023-10-09 ENCOUNTER — Ambulatory Visit (INDEPENDENT_AMBULATORY_CARE_PROVIDER_SITE_OTHER): Payer: BC Managed Care – PPO | Admitting: Internal Medicine

## 2023-10-09 ENCOUNTER — Other Ambulatory Visit (HOSPITAL_BASED_OUTPATIENT_CLINIC_OR_DEPARTMENT_OTHER): Payer: Self-pay

## 2023-10-09 VITALS — BP 105/64 | HR 91 | Temp 98.3°F | Ht 69.0 in | Wt 203.0 lb

## 2023-10-09 DIAGNOSIS — E669 Obesity, unspecified: Secondary | ICD-10-CM

## 2023-10-09 DIAGNOSIS — Z6829 Body mass index (BMI) 29.0-29.9, adult: Secondary | ICD-10-CM | POA: Diagnosis not present

## 2023-10-09 DIAGNOSIS — E1169 Type 2 diabetes mellitus with other specified complication: Secondary | ICD-10-CM | POA: Diagnosis not present

## 2023-10-09 DIAGNOSIS — Z7985 Long-term (current) use of injectable non-insulin antidiabetic drugs: Secondary | ICD-10-CM

## 2023-10-09 DIAGNOSIS — F509 Eating disorder, unspecified: Secondary | ICD-10-CM

## 2023-10-09 DIAGNOSIS — Z7984 Long term (current) use of oral hypoglycemic drugs: Secondary | ICD-10-CM

## 2023-10-09 MED ORDER — TIRZEPATIDE 5 MG/0.5ML ~~LOC~~ SOAJ
5.0000 mg | SUBCUTANEOUS | 0 refills | Status: DC
Start: 2023-10-09 — End: 2023-11-11
  Filled 2023-10-09 – 2023-10-13 (×3): qty 2, 28d supply, fill #0

## 2023-10-09 NOTE — Progress Notes (Signed)
Office: [phone]  /  Fax: [phone]  WEIGHT SUMMARY AND BIOMETRICS  Vitals Temp: 98.3 F (36.8 C) BP: 105/64 Pulse Rate: 91 SpO2: 98 %   Anthropometric Measurements Height: 5\' 9"  (1.753 m) Weight: 203 lb (92.1 kg) BMI (Calculated): 29.96 Weight at Last Visit: 202 lb Weight Lost Since Last Visit: 0 lb Weight Gained Since Last Visit: 1 lb Starting Weight: 208 lb Total Weight Loss (lbs): 5 lb (2.268 kg) Peak Weight: 217 lb   Body Composition  Body Fat %: 34.7 % Fat Mass (lbs): 70.6 lbs Muscle Mass (lbs): 126.2 lbs Total Body Water (lbs): 84 lbs Visceral Fat Rating : 9    RMR: 1498  Today's Visit #: 5  Starting Date: 07/08/23   HPI  Chief Complaint: OBESITY  Kendra Saunders is here to discuss her progress with her obesity treatment plan. She is on the the Category 2 Plan and states she is following her eating plan approximately 50 % of the time. She states she is not exercising.  Interval History:   Discussed the use of AI scribe software for clinical note transcription with the patient, who gave verbal consent to proceed.  History of Present Illness   The patient, an Production designer, theatre/television/film at a school, presents today for weight management. She reports a recent weight gain of approximately 3-4 pounds, which he attributes to an increased intake of candy, specifically mini chocolates, which he keeps in a bowl on his desk at work. She admits to consuming two pieces daily, and acknowledges a long-standing preference for sweets, which he describes as an addiction. She notes that this craving for sweets is particularly strong during the workday, and less so when at home, where he does not keep candy.  His dietary habits otherwise consist of a breakfast of scrambled eggs, keto bread, and bacon, followed by a bottle of water. Lunch is typically whatever is served in [employer], which he describes as a small portion of meat and sides. Dinner at home varies, with recent  meals including fruit, protein shakes, and leftover baked spaghetti made with [place] meat and chicken [place] sausage.  The patient also reports a decrease in his usual walking activity over the past week. She is currently on Zepbound 2.5 mg once a week which was started three weeks ago. She expresses a willingness to increase his efforts to control his sugar intake and to see how he responds to an increased dose of his current medication before considering additional interventions such as behavioral therapy or additional medications.     Orexigenic Control: Reports problems with appetite and hunger signals.  Denies problems with satiety and satiation.  Denies problems with eating patterns and portion control.  Reports abnormal cravings. Denies feeling deprived or restricted.   Barriers identified: strong hunger signals and impaired satiety / inhibitory control.   Pharmacotherapy for weight loss: She is currently taking Zepbound with adequate clinical response  and without side effects..    ASSESSMENT AND PLAN  TREATMENT PLAN FOR OBESITY:  Recommended Dietary Goals  Kendra Saunders is currently in the action stage of change. As such, her goal is to continue weight management plan. She has agreed to: continue current plan  Behavioral Intervention  We discussed the following Behavioral Modification Strategies today: continue to work on maintaining a reduced calorie state, getting the recommended amount of protein, incorporating whole foods, making healthy choices, staying well hydrated and practicing mindfulness when eating..  Additional resources provided today: None  Recommended Physical Activity Goals  Kendra Saunders  has been advised to work up to 150 minutes of moderate intensity aerobic activity a week and strengthening exercises 2-3 times per week for cardiovascular health, weight loss maintenance and preservation of muscle mass.   She has agreed to :  Think about enjoyable ways to increase  daily physical activity and overcoming barriers to exercise and Increase physical activity in their day and reduce sedentary time (increase NEAT).  Pharmacotherapy We discussed various medication options to help Kendra Saunders with her weight loss efforts and we both agreed to : increase Zepbound to 5 mg once a week  ASSOCIATED CONDITIONS ADDRESSED TODAY  Eating disorder, unspecified type Assessment & Plan: Patient has compulsion of eating sweets she reports this being a problem for most of her life.  I suspect she may have a degree of sugar addiction and therefore would benefit from cognitive behavioral therapy.  We discussed ways to reduce exposure.  We also reviewed with her the carb insulin model as well as stress which may drive the consumption of carbohydrates.  We will increase her Zepbound to 5 mg once a week and see how she does she is willing to consider speaking with a cognitive behavioral specialist.   Type 2 diabetes mellitus with other specified complication, without long-term current use of insulin (HCC) Assessment & Plan: Her most recent A1c is 6.3.  She is currently on metformin 500 mg once a day and Zepbound 2.5 mg once a week.  We will increase medication to 5 mg.  Patient also counseled on working on environmental controls to reduce the consumption of sweets.  She was counseled on different strategies today.  Orders: -     Tirzepatide; Inject 5 mg into the skin once a week.  Dispense: 2 mL; Refill: 0    PHYSICAL EXAM:  Blood pressure 105/64, pulse 91, temperature 98.3 F (36.8 C), height 5\' 9"  (1.753 m), weight 203 lb (92.1 kg), last menstrual period 05/08/2017, SpO2 98%. Body mass index is 29.98 kg/m.  General: She is overweight, cooperative, alert, well developed, and in no acute distress. PSYCH: Has normal mood, affect and thought process.   HEENT: EOMI, sclerae are anicteric. Lungs: Normal breathing effort, no conversational dyspnea. Extremities: No edema.   Neurologic: No gross sensory or motor deficits. No tremors or fasciculations noted.    DIAGNOSTIC DATA REVIEWED:  BMET    Component Value Date/Time   NA 140 07/08/2023 0857   K 4.7 07/08/2023 0857   CL 102 07/08/2023 0857   CO2 22 07/08/2023 0857   GLUCOSE 87 07/08/2023 0857   GLUCOSE 90 06/25/2016 1229   BUN 10 07/08/2023 0857   CREATININE 0.94 07/08/2023 0857   CREATININE 0.87 06/25/2016 1229   CALCIUM 9.9 07/08/2023 0857   GFRNONAA 81 07/01/2018 0850   GFRNONAA 77 10/19/2014 0954   GFRAA 93 07/01/2018 0850   GFRAA 89 10/19/2014 0954   Lab Results  Component Value Date   HGBA1C 6.3 (H) 07/08/2023   HGBA1C 5.7 (H) 10/19/2014   Lab Results  Component Value Date   INSULIN 18.6 07/08/2023   Lab Results  Component Value Date   TSH 1.960 12/29/2017   CBC    Component Value Date/Time   WBC 5.8 07/01/2018 0850   WBC 4.1 06/25/2016 1229   RBC 4.91 07/01/2018 0850   RBC 4.64 06/25/2016 1229   HGB 13.5 07/01/2018 0850   HCT 41.1 07/01/2018 0850   PLT 281 07/01/2018 0850   MCV 84 07/01/2018 0850   MCH 27.5 07/01/2018 0850  MCH 27.6 06/25/2016 1229   MCHC 32.8 07/01/2018 0850   MCHC 34.1 06/25/2016 1229   RDW 14.8 07/01/2018 0850   Iron Studies    Component Value Date/Time   IRON 118 06/13/2014 1121   TIBC 352 06/13/2014 1121   IRONPCTSAT 34 06/13/2014 1121   Lipid Panel     Component Value Date/Time   CHOL 138 07/08/2023 0857   TRIG 131 07/08/2023 0857   TRIG 108 12/01/2006 1058   HDL 36 (L) 07/08/2023 0857   CHOLHDL 6.5 (H) 07/01/2018 0919   CHOLHDL 5.0 10/19/2014 0954   VLDL 37 10/19/2014 0954   LDLCALC 79 07/08/2023 0857   Hepatic Function Panel     Component Value Date/Time   PROT 7.6 07/08/2023 0857   ALBUMIN 4.9 07/08/2023 0857   AST 18 07/08/2023 0857   ALT 20 07/08/2023 0857   ALKPHOS 53 07/08/2023 0857   BILITOT 0.4 07/08/2023 0857      Component Value Date/Time   TSH 1.960 12/29/2017 1814   Nutritional Lab Results  Component  Value Date   VD25OH 58.2 07/08/2023   VD25OH 38.1 12/03/2016   VD25OH 28 (L) 06/25/2016     Return in about 3 weeks (around 10/30/2023) for For Weight Mangement with Dr. Rikki Spearing.Marland Kitchen She was informed of the importance of frequent follow up visits to maximize her success with intensive lifestyle modifications for her multiple health conditions.   ATTESTASTION STATEMENTS:  Reviewed by clinician on day of visit: allergies, medications, problem list, medical history, surgical history, family history, social history, and previous encounter notes.     Worthy Rancher, MD

## 2023-10-09 NOTE — Assessment & Plan Note (Signed)
Patient has compulsion of eating sweets she reports this being a problem for most of her life.  I suspect she may have a degree of sugar addiction and therefore would benefit from cognitive behavioral therapy.  We discussed ways to reduce exposure.  We also reviewed with her the carb insulin model as well as stress which may drive the consumption of carbohydrates.  We will increase her Zepbound to 5 mg once a week and see how she does she is willing to consider speaking with a cognitive behavioral specialist.

## 2023-10-09 NOTE — Assessment & Plan Note (Signed)
Her most recent A1c is 6.3.  She is currently on metformin 500 mg once a day and Zepbound 2.5 mg once a week.  We will increase medication to 5 mg.  Patient also counseled on working on environmental controls to reduce the consumption of sweets.  She was counseled on different strategies today.

## 2023-10-11 ENCOUNTER — Other Ambulatory Visit (HOSPITAL_BASED_OUTPATIENT_CLINIC_OR_DEPARTMENT_OTHER): Payer: Self-pay

## 2023-10-13 ENCOUNTER — Other Ambulatory Visit: Payer: Self-pay

## 2023-11-10 ENCOUNTER — Ambulatory Visit (INDEPENDENT_AMBULATORY_CARE_PROVIDER_SITE_OTHER): Payer: BC Managed Care – PPO | Admitting: Internal Medicine

## 2023-11-11 ENCOUNTER — Other Ambulatory Visit (HOSPITAL_BASED_OUTPATIENT_CLINIC_OR_DEPARTMENT_OTHER): Payer: Self-pay

## 2023-11-11 ENCOUNTER — Encounter (INDEPENDENT_AMBULATORY_CARE_PROVIDER_SITE_OTHER): Payer: Self-pay | Admitting: Internal Medicine

## 2023-11-11 ENCOUNTER — Ambulatory Visit (INDEPENDENT_AMBULATORY_CARE_PROVIDER_SITE_OTHER): Payer: BC Managed Care – PPO | Admitting: Internal Medicine

## 2023-11-11 VITALS — BP 106/68 | HR 80 | Temp 97.5°F | Ht 69.0 in | Wt 195.0 lb

## 2023-11-11 DIAGNOSIS — R638 Other symptoms and signs concerning food and fluid intake: Secondary | ICD-10-CM | POA: Diagnosis not present

## 2023-11-11 DIAGNOSIS — Z723 Lack of physical exercise: Secondary | ICD-10-CM

## 2023-11-11 DIAGNOSIS — Z6830 Body mass index (BMI) 30.0-30.9, adult: Secondary | ICD-10-CM

## 2023-11-11 DIAGNOSIS — E66811 Obesity, class 1: Secondary | ICD-10-CM | POA: Diagnosis not present

## 2023-11-11 DIAGNOSIS — R948 Abnormal results of function studies of other organs and systems: Secondary | ICD-10-CM | POA: Diagnosis not present

## 2023-11-11 DIAGNOSIS — E1169 Type 2 diabetes mellitus with other specified complication: Secondary | ICD-10-CM | POA: Diagnosis not present

## 2023-11-11 DIAGNOSIS — Z7985 Long-term (current) use of injectable non-insulin antidiabetic drugs: Secondary | ICD-10-CM

## 2023-11-11 MED ORDER — TIRZEPATIDE 5 MG/0.5ML ~~LOC~~ SOAJ
5.0000 mg | SUBCUTANEOUS | 0 refills | Status: DC
  Filled 2023-11-11: qty 2, 28d supply, fill #0

## 2023-11-11 NOTE — Progress Notes (Signed)
Office: [phone]  /  Fax: [phone]  Weight Summary And Biometrics  Vitals Temp: (!) 97.5 F (36.4 C) BP: 106/68 Pulse Rate: 80 SpO2: 100 %   Anthropometric Measurements Height: 5\' 9"  (1.753 m) Weight: 195 lb (88.5 kg) BMI (Calculated): 28.78 Weight at Last Visit: 203 lb Weight Lost Since Last Visit: 8 lb Weight Gained Since Last Visit: 0 lb Starting Weight: 208 lb Total Weight Loss (lbs): 13 lb (5.897 kg) Peak Weight: 217 lb   Body Composition  Body Fat %: 33.3 % Fat Mass (lbs): 65 lbs Muscle Mass (lbs): 123.6 lbs Total Body Water (lbs): 81.6 lbs Visceral Fat Rating : 8    RMR: 1498  Today's Visit #: 6  Starting Date: 07/08/23   Subjective   Chief Complaint: Obesity  Kendra Saunders is here to discuss her progress with her obesity treatment plan. She is on the the Category 2 Plan and states she is following her eating plan approximately 50 % of the time. She states she is not exercising.  Interval History:   Since last office visit she has lost 8 pounds. She reports fair adherence to reduced calorie nutritional plan. She has been working on reading food labels, not skipping meals, increasing protein intake at every meal, drinking more water, making healthier choices, reducing portion sizes, and incorporating more whole foods   Orexigenic Control:  Denies problems with appetite and hunger signals.  Denies problems with satiety and satiation.  Denies problems with eating patterns and portion control.  Denies abnormal cravings. Denies feeling deprived or restricted.   Barriers identified: none and low volume of physical activity at present .   Pharmacotherapy for weight loss: She is currently taking Monjauro with diabetes as the primary indication with adequate clinical response  and without side effects..   Assessment and Plan   Treatment Plan For Obesity:  Recommended Dietary Goals  [name] is currently in the action stage of change. As such,  her goal is to continue weight management plan. She has agreed to: continue current plan  Behavioral Intervention  We discussed the following Behavioral Modification Strategies today: increasing lean protein intake to established goals, decreasing simple carbohydrates , increasing vegetables, increasing lower glycemic fruits, increasing fiber rich foods, and avoiding skipping meals.  Additional resources provided today: None  Recommended Physical Activity Goals  Orly has been advised to work up to 150 minutes of moderate intensity aerobic activity a week and strengthening exercises 2-3 times per week for cardiovascular health, weight loss maintenance and preservation of muscle mass.   She has agreed to :  Think about enjoyable ways to increase daily physical activity and overcoming barriers to exercise and Increase physical activity in their day and reduce sedentary time (increase NEAT).  Pharmacotherapy  We discussed various medication options to help [name] with her weight loss efforts and we both agreed to : continue current anti-obesity medication regimen  Associated Conditions Addressed Today  Class 1 obesity with serious comorbidity and body mass index (BMI) of 30.0 to 30.9 in adult, unspecified obesity type Assessment & Plan: Since last office visit she has lost 8 pounds her bioimpedance readings suggest muscle loss around 40% which is more than expected.  Unfortunately she has not been exercising but reports good adherence with recommended protein intake which is about 90 to 100 g/day.  We discussed the benefits of strengthening to prevent muscle loss.  If she continues to lose muscle at this rate we may have to lower the dose, to allow increase  caloric intake and slow down weight loss rate to maybe a pound per week.   Type 2 diabetes mellitus with other specified complication, without long-term current use of insulin (HCC) Assessment & Plan: Her most recent A1c is 6.3.  She  is currently on metformin 500 mg once a day and Zepbound 5 mg once a week.  No symptoms of hypoglycemia.  Because of rapid weight loss with associated muscle loss she will remain at current dose.  She will have disease monitoring labs at her next follow-up with PCP.  Orders: -     Tirzepatide; Inject 5 mg into the skin once a week.  Dispense: 2 mL; Refill: 0  Physically inactive Assessment & Plan: We discussed the benefits of regular physical activity.    Abnormal metabolism Assessment & Plan: Patient has a slower than predicted metabolism. IC 1498 vs. calculated 1867. This may contribute to weight gain, chronic fatigue and difficulty losing weight.  We reviewed measures to improve metabolism including not skipping meals, progressive strengthening exercises, increasing protein intake at every meal and maintaining adequate hydration and sleep.      Abnormal food appetite Assessment & Plan: Improved on GLP-1.  She had increased orixegenic signaling, impaired satiety and inhibitory control. This is secondary to an abnormal energy regulation system and pathological neurohormonal pathways characteristic of excess adiposity.  In addition to nutritional and behavioral strategies she benefits from ongoing pharmacotherapy.        Objective   Physical Exam:  Blood pressure 106/68, pulse 80, temperature (!) 97.5 F (36.4 C), height 5\' 9"  (1.753 m), weight 195 lb (88.5 kg), last menstrual period 05/08/2017, SpO2 100%. Body mass index is 28.8 kg/m.  General: She is overweight, cooperative, alert, well developed, and in no acute distress. PSYCH: Has normal mood, affect and thought process.   HEENT: EOMI, sclerae are anicteric. Lungs: Normal breathing effort, no conversational dyspnea. Extremities: No edema.  Neurologic: No gross sensory or motor deficits. No tremors or fasciculations noted.    Diagnostic Data Reviewed:  BMET    Component Value Date/Time   NA 140 07/08/2023 0857   K  4.7 07/08/2023 0857   CL 102 07/08/2023 0857   CO2 22 07/08/2023 0857   GLUCOSE 87 07/08/2023 0857   GLUCOSE 90 06/25/2016 1229   BUN 10 07/08/2023 0857   CREATININE 0.94 07/08/2023 0857   CREATININE 0.87 06/25/2016 1229   CALCIUM 9.9 07/08/2023 0857   GFRNONAA 81 07/01/2018 0850   GFRNONAA 77 10/19/2014 0954   GFRAA 93 07/01/2018 0850   GFRAA 89 10/19/2014 0954   Lab Results  Component Value Date   HGBA1C 6.3 (H) 07/08/2023   HGBA1C 5.7 (H) 10/19/2014   Lab Results  Component Value Date   INSULIN 18.6 07/08/2023   Lab Results  Component Value Date   TSH 1.960 12/29/2017   CBC    Component Value Date/Time   WBC 5.8 07/01/2018 0850   WBC 4.1 06/25/2016 1229   RBC 4.91 07/01/2018 0850   RBC 4.64 06/25/2016 1229   HGB 13.5 07/01/2018 0850   HCT 41.1 07/01/2018 0850   PLT 281 07/01/2018 0850   MCV 84 07/01/2018 0850   MCH 27.5 07/01/2018 0850   MCH 27.6 06/25/2016 1229   MCHC 32.8 07/01/2018 0850   MCHC 34.1 06/25/2016 1229   RDW 14.8 07/01/2018 0850   Iron Studies    Component Value Date/Time   IRON 118 06/13/2014 1121   TIBC 352 06/13/2014 1121   IRONPCTSAT 34 06/13/2014  1121   Lipid Panel     Component Value Date/Time   CHOL 138 07/08/2023 0857   TRIG 131 07/08/2023 0857   TRIG 108 12/01/2006 1058   HDL 36 (L) 07/08/2023 0857   CHOLHDL 6.5 (H) 07/01/2018 0919   CHOLHDL 5.0 10/19/2014 0954   VLDL 37 10/19/2014 0954   LDLCALC 79 07/08/2023 0857   Hepatic Function Panel     Component Value Date/Time   PROT 7.6 07/08/2023 0857   ALBUMIN 4.9 07/08/2023 0857   AST 18 07/08/2023 0857   ALT 20 07/08/2023 0857   ALKPHOS 53 07/08/2023 0857   BILITOT 0.4 07/08/2023 0857      Component Value Date/Time   TSH 1.960 12/29/2017 1814   Nutritional Lab Results  Component Value Date   VD25OH 58.2 07/08/2023   VD25OH 38.1 12/03/2016   VD25OH 28 (L) 06/25/2016    Follow-Up   Return in about 4 weeks (around 12/09/2023) for For Weight Mangement with Dr.  Rikki Spearing.Marland Kitchen She was informed of the importance of frequent follow up visits to maximize her success with intensive lifestyle modifications for her multiple health conditions.  Attestation Statement   Reviewed by clinician on day of visit: allergies, medications, problem list, medical history, surgical history, family history, social history, and previous encounter notes.     Worthy Rancher, MD

## 2023-11-11 NOTE — Assessment & Plan Note (Signed)
We discussed the benefits of regular physical activity.

## 2023-11-11 NOTE — Assessment & Plan Note (Signed)
Improved on GLP-1.  She had increased orixegenic signaling, impaired satiety and inhibitory control. This is secondary to an abnormal energy regulation system and pathological neurohormonal pathways characteristic of excess adiposity.  In addition to nutritional and behavioral strategies she benefits from ongoing pharmacotherapy.

## 2023-11-11 NOTE — Assessment & Plan Note (Signed)
Since last office visit she has lost 8 pounds her bioimpedance readings suggest muscle loss around 40% which is more than expected.  Unfortunately she has not been exercising but reports good adherence with recommended protein intake which is about 90 to 100 g/day.  We discussed the benefits of strengthening to prevent muscle loss.  If she continues to lose muscle at this rate we may have to lower the dose, to allow increase caloric intake and slow down weight loss rate to maybe a pound per week.

## 2023-11-11 NOTE — Assessment & Plan Note (Signed)
Patient has a slower than predicted metabolism. IC 1498 vs. calculated 1867. This may contribute to weight gain, chronic fatigue and difficulty losing weight.  We reviewed measures to improve metabolism including not skipping meals, progressive strengthening exercises, increasing protein intake at every meal and maintaining adequate hydration and sleep.

## 2023-11-11 NOTE — Assessment & Plan Note (Signed)
Her most recent A1c is 6.3.  She is currently on metformin 500 mg once a day and Zepbound 5 mg once a week.  No symptoms of hypoglycemia.  Because of rapid weight loss with associated muscle loss she will remain at current dose.  She will have disease monitoring labs at her next follow-up with PCP.

## 2023-12-10 ENCOUNTER — Other Ambulatory Visit: Payer: Self-pay | Admitting: Medical Genetics

## 2023-12-11 ENCOUNTER — Other Ambulatory Visit (HOSPITAL_COMMUNITY)
Admission: RE | Admit: 2023-12-11 | Discharge: 2023-12-11 | Disposition: A | Discharge status: Home / Self Care | Payer: Self-pay | Source: Ambulatory Visit | Attending: Medical Genetics | Admitting: Medical Genetics

## 2023-12-17 ENCOUNTER — Encounter (INDEPENDENT_AMBULATORY_CARE_PROVIDER_SITE_OTHER): Payer: Self-pay | Admitting: Internal Medicine

## 2023-12-17 ENCOUNTER — Other Ambulatory Visit (HOSPITAL_BASED_OUTPATIENT_CLINIC_OR_DEPARTMENT_OTHER): Payer: Self-pay

## 2023-12-17 ENCOUNTER — Ambulatory Visit (INDEPENDENT_AMBULATORY_CARE_PROVIDER_SITE_OTHER): Payer: 59 | Admitting: Internal Medicine

## 2023-12-17 VITALS — BP 104/66 | HR 74 | Temp 98.1°F | Ht 69.0 in | Wt 191.0 lb

## 2023-12-17 DIAGNOSIS — I10 Essential (primary) hypertension: Secondary | ICD-10-CM | POA: Diagnosis not present

## 2023-12-17 DIAGNOSIS — E66811 Obesity, class 1: Secondary | ICD-10-CM | POA: Diagnosis not present

## 2023-12-17 DIAGNOSIS — Z7985 Long-term (current) use of injectable non-insulin antidiabetic drugs: Secondary | ICD-10-CM

## 2023-12-17 DIAGNOSIS — R638 Other symptoms and signs concerning food and fluid intake: Secondary | ICD-10-CM

## 2023-12-17 DIAGNOSIS — Z723 Lack of physical exercise: Secondary | ICD-10-CM

## 2023-12-17 DIAGNOSIS — E1169 Type 2 diabetes mellitus with other specified complication: Secondary | ICD-10-CM | POA: Diagnosis not present

## 2023-12-17 DIAGNOSIS — Z6830 Body mass index (BMI) 30.0-30.9, adult: Secondary | ICD-10-CM

## 2023-12-17 MED ORDER — TIRZEPATIDE 5 MG/0.5ML ~~LOC~~ SOAJ
5.0000 mg | SUBCUTANEOUS | 1 refills | Status: DC
  Filled 2023-12-17: qty 2, 28d supply, fill #0
  Filled 2024-01-12: qty 2, 28d supply, fill #1

## 2023-12-17 NOTE — Progress Notes (Signed)
 Office: [phone]  /  Fax: [phone]  Weight Summary And Biometrics  No data recorded  No data recorded  No data recorded   No data recorded  No data recorded  No data recorded   Subjective   Chief Complaint: Obesity  Kendra Saunders is here to discuss her progress with her obesity treatment plan. Kendra Saunders is on the the Category 2 Plan and states Kendra Saunders is following her eating plan approximately 50 % of the time. Kendra Saunders states Kendra Saunders is not exercising.  Interval History:   Discussed the use of AI scribe software for clinical note transcription with the patient, who gave verbal consent to proceed.  History of Present Illness   The patient, affected by obesity, type two diabetes, abnormal food appetite, hypertension, and hypercholesterolemia, presents for medical weight management. Kendra Saunders is currently on Mounjaro  five milligrams once a week for diabetes and metformin . Kendra Saunders reports adherence to a reduced calorie nutrition plan about fifty percent of the time and is not currently engaging in exercise.  Over the holidays, the patient lost four pounds, which was surprising to her. Kendra Saunders attributes this weight loss to stopping eating when Kendra Saunders feels full. Kendra Saunders is consuming three meals a day and not skipping any meals. If a meal is missed, Kendra Saunders substitutes with a protein shake. Kendra Saunders is trying to focus on protein intake due to previous discussions about muscle loss.  The patient did not take her usual injection this week due to running out of injections.  BIA suggest further reductions in muscle mass.  Kendra Saunders acknowledges the need for exercise but is currently not engaging in any physical activity.  The patient works approximately fifty to sixty hours a week, which limits her time for exercise. Kendra Saunders expresses interest in purchasing a walking pad for use during work hours. Kendra Saunders also considers going to the gym after work once a week.  The patient reports good appetite control and feeling full. Kendra Saunders has not lost joy in  eating and is incorporating more whole foods into her diet. Kendra Saunders eats out about three times a week and reports that the medication is helping her control her portions.  The patient's blood pressure is in the low normal range, and Kendra Saunders is advised to monitor for symptoms of lightheadedness or dizziness.       Orexigenic Control:  Denies problems with appetite and hunger signals.  Denies problems with satiety and satiation.  Denies problems with eating patterns and portion control.  Denies abnormal cravings. Denies feeling deprived or restricted.   Barriers identified: lack of time for self-care, low volume of physical activity at present , and medical comorbidities.   Pharmacotherapy for weight loss: Kendra Saunders is currently taking Mounjaro  5 mg once a week and metformin  for diabetes management and secondary weight management.   Assessment and Plan   Treatment Plan For Obesity:  Recommended Dietary Goals  Kendra Saunders is currently in the action stage of change. As such, her goal is to continue weight management plan. Kendra Saunders has agreed to: continue current plan  Behavioral Intervention  We discussed the following Behavioral Modification Strategies today: increasing lean protein intake to established goals and continue to work on maintaining a reduced calorie state, getting the recommended amount of protein, incorporating whole foods, making healthy choices, staying well hydrated and practicing mindfulness when eating..  Additional resources provided today: None  Recommended Physical Activity Goals  Kendra Saunders has been advised to work up to 150 minutes of moderate intensity aerobic activity a week and strengthening exercises 2-3  times per week for cardiovascular health, weight loss maintenance and preservation of muscle mass.   Kendra Saunders has agreed to :  Think about enjoyable ways to increase daily physical activity and overcoming barriers to exercise and Increase physical activity in their day and reduce  sedentary time (increase NEAT).  Pharmacotherapy  We discussed various medication options to help Kendra Saunders with her weight loss efforts and we both agreed to : continue current anti-obesity medication regimen and do not recommend further increases in GLP-1 therapy due to muscle loss.  Associated Conditions Addressed and Impacted by Obesity Treatment  Class 1 obesity with serious comorbidity and body mass index (BMI) of 30.0 to 30.9 in adult, unspecified obesity type  Type 2 diabetes mellitus with other specified complication, without long-term current use of insulin  (HCC) -     Tirzepatide ; Inject 5 mg into the skin once a week.  Dispense: 2 mL; Refill: 1  Abnormal food appetite  Physically inactive  Primary hypertension    Assessment and Plan    Obesity Currently on a medical weight management plan. Lost 4 pounds over the holidays but experiencing muscle loss due to inadequate protein intake and lack of exercise. Follows a reduced-calorie nutrition plan 50% of the time and is not exercising. Emphasized the importance of protein intake and physical activity to prevent further muscle loss and support weight loss. Discussed the need for long-term lifestyle changes to prevent weight regain if medication is discontinued. - Continue Mounjaro  5 mg once a week - Increase protein intake to 20-30 grams per meal, aiming for 90-120 grams per day - Prioritize protein in meals, followed by fiber, fruits, and vegetables, and then carbohydrates - Consider protein snacks such as yogurt, eggs, nuts, fruits with peanut butter, and vegetables with hummus - Start a full-body workout once a week, preferably at the gym - Incorporate walking or use a walking pad for short intervals during the day - Follow up in 3-4 weeks to monitor muscle mass and adjust medication if necessary  Abnormal Food Appetite Reports feeling full more quickly due to medication, which helps control portions but may contribute to  inadequate protein intake and muscle loss. Emphasized the importance of prioritizing protein intake to prevent muscle loss. - Adjust dietary intake to ensure adequate protein consumption  Type 2 Diabetes Mellitus Currently on Mounjaro  and metformin . Blood glucose control appears adequate, but missed a dose of Mounjaro  this week due to running out of injections. Discussed the importance of not missing doses to maintain blood glucose control. - Refill Mounjaro  prescription and send to [facility] - Monitor blood glucose levels regularly  Hypertension Well-controlled with a blood pressure reading of 104/66 mmHg.  Kendra Saunders is currently on olmesartan 20 mg once a day.  Advised monitoring for symptoms of dizziness or lightheadedness, which could indicate the need to adjust blood pressure medication. - Monitor for symptoms of dizziness or lightheadedness - Consider adjusting blood pressure medication if symptoms occur  General Health Maintenance Advised to incorporate more whole foods, fruits, and vegetables into diet. Encouraged to reduce eating out to manage calorie intake better. - Increase intake of whole foods, fruits, and vegetables - Reduce frequency of eating out to manage calorie intake  Follow-up - Follow up in 3-4 weeks to monitor muscle mass and adjust medication if necessary.       Objective   Physical Exam:  Blood pressure 104/66, pulse 74, temperature 98.1 F (36.7 C), height 5' 9 (1.753 m), weight 191 lb (86.6 kg),  last menstrual period 05/08/2017, SpO2 98%. Body mass index is 28.21 kg/m.  General: Kendra Saunders is overweight, cooperative, alert, well developed, and in no acute distress. PSYCH: Has normal mood, affect and thought process.   HEENT: EOMI, sclerae are anicteric. Lungs: Normal breathing effort, no conversational dyspnea. Extremities: No edema.  Neurologic: No gross sensory or motor deficits. No tremors or fasciculations noted.    Diagnostic Data  Reviewed:  BMET    Component Value Date/Time   NA 140 07/08/2023 0857   K 4.7 07/08/2023 0857   CL 102 07/08/2023 0857   CO2 22 07/08/2023 0857   GLUCOSE 87 07/08/2023 0857   GLUCOSE 90 06/25/2016 1229   BUN 10 07/08/2023 0857   CREATININE 0.94 07/08/2023 0857   CREATININE 0.87 06/25/2016 1229   CALCIUM 9.9 07/08/2023 0857   GFRNONAA 81 07/01/2018 0850   GFRNONAA 77 10/19/2014 0954   GFRAA 93 07/01/2018 0850   GFRAA 89 10/19/2014 0954   Lab Results  Component Value Date   HGBA1C 6.3 (H) 07/08/2023   HGBA1C 5.7 (H) 10/19/2014   Lab Results  Component Value Date   INSULIN  18.6 07/08/2023   Lab Results  Component Value Date   TSH 1.960 12/29/2017   CBC    Component Value Date/Time   WBC 5.8 07/01/2018 0850   WBC 4.1 06/25/2016 1229   RBC 4.91 07/01/2018 0850   RBC 4.64 06/25/2016 1229   HGB 13.5 07/01/2018 0850   HCT 41.1 07/01/2018 0850   PLT 281 07/01/2018 0850   MCV 84 07/01/2018 0850   MCH 27.5 07/01/2018 0850   MCH 27.6 06/25/2016 1229   MCHC 32.8 07/01/2018 0850   MCHC 34.1 06/25/2016 1229   RDW 14.8 07/01/2018 0850   Iron Studies    Component Value Date/Time   IRON 118 06/13/2014 1121   TIBC 352 06/13/2014 1121   IRONPCTSAT 34 06/13/2014 1121   Lipid Panel     Component Value Date/Time   CHOL 138 07/08/2023 0857   TRIG 131 07/08/2023 0857   TRIG 108 12/01/2006 1058   HDL 36 (L) 07/08/2023 0857   CHOLHDL 6.5 (H) 07/01/2018 0919   CHOLHDL 5.0 10/19/2014 0954   VLDL 37 10/19/2014 0954   LDLCALC 79 07/08/2023 0857   Hepatic Function Panel     Component Value Date/Time   PROT 7.6 07/08/2023 0857   ALBUMIN 4.9 07/08/2023 0857   AST 18 07/08/2023 0857   ALT 20 07/08/2023 0857   ALKPHOS 53 07/08/2023 0857   BILITOT 0.4 07/08/2023 0857      Component Value Date/Time   TSH 1.960 12/29/2017 1814   Nutritional Lab Results  Component Value Date   VD25OH 58.2 07/08/2023   VD25OH 38.1 12/03/2016   VD25OH 28 (L) 06/25/2016    Follow-Up    Return in about 3 weeks (around 01/07/2024) for For Weight Mangement with Dr. Francyne.Kendra Saunders Kendra Saunders was informed of the importance of frequent follow up visits to maximize her success with intensive lifestyle modifications for her multiple health conditions.  Attestation Statement   Reviewed by clinician on day of visit: allergies, medications, problem list, medical history, surgical history, family history, social history, and previous encounter notes.     Lucas Francyne, MD

## 2023-12-22 LAB — GENECONNECT MOLECULAR SCREEN: Genetic Analysis Overall Interpretation: NEGATIVE

## 2024-01-12 ENCOUNTER — Ambulatory Visit (INDEPENDENT_AMBULATORY_CARE_PROVIDER_SITE_OTHER): Payer: 59 | Admitting: Internal Medicine

## 2024-01-12 ENCOUNTER — Encounter (INDEPENDENT_AMBULATORY_CARE_PROVIDER_SITE_OTHER): Payer: Self-pay | Admitting: Internal Medicine

## 2024-01-12 ENCOUNTER — Other Ambulatory Visit (INDEPENDENT_AMBULATORY_CARE_PROVIDER_SITE_OTHER): Payer: Self-pay | Admitting: Internal Medicine

## 2024-01-12 VITALS — BP 121/79 | HR 84 | Temp 98.3°F | Ht 69.0 in | Wt 194.0 lb

## 2024-01-12 DIAGNOSIS — R638 Other symptoms and signs concerning food and fluid intake: Secondary | ICD-10-CM | POA: Diagnosis not present

## 2024-01-12 DIAGNOSIS — E66811 Obesity, class 1: Secondary | ICD-10-CM | POA: Diagnosis not present

## 2024-01-12 DIAGNOSIS — E1169 Type 2 diabetes mellitus with other specified complication: Secondary | ICD-10-CM

## 2024-01-12 DIAGNOSIS — R948 Abnormal results of function studies of other organs and systems: Secondary | ICD-10-CM | POA: Diagnosis not present

## 2024-01-12 DIAGNOSIS — Z6830 Body mass index (BMI) 30.0-30.9, adult: Secondary | ICD-10-CM

## 2024-01-12 DIAGNOSIS — Z7985 Long-term (current) use of injectable non-insulin antidiabetic drugs: Secondary | ICD-10-CM

## 2024-01-12 MED ORDER — TIRZEPATIDE 7.5 MG/0.5ML ~~LOC~~ SOAJ: 7.5000 mg | SUBCUTANEOUS | 0 refills | Status: DC

## 2024-01-12 NOTE — Progress Notes (Signed)
Office: [phone]  /  Fax: [phone]  Weight Summary And Biometrics  Vitals Temp: 98.3 F (36.8 C) BP: 121/79 Pulse Rate: 84 SpO2: 100 %   Anthropometric Measurements Height: 5\' 9"  (1.753 m) Weight: 194 lb (88 kg) BMI (Calculated): 28.64 Weight at Last Visit: 191 lb Weight Lost Since Last Visit: 0 lb Weight Gained Since Last Visit: 3 lb Starting Weight: 208 lb Total Weight Loss (lbs): 14 lb (6.35 kg) Peak Weight: 217 lb   Body Composition  Body Fat %: 34.1 % Fat Mass (lbs): 66.4 lbs Muscle Mass (lbs): 122 lbs Total Body Water (lbs): 83.4 lbs Visceral Fat Rating : 8    RMR: 1498  Today's Visit #: 7  Starting Date: 07/08/23   Subjective   Chief Complaint: Obesity  Kendra Saunders is here to discuss her progress with her obesity treatment plan. She is on the the Category 2 Plan and states she is following her eating plan approximately 50 % of the time. She states she is exercising 30 minutes 3 times per week.  Weight Progress Since Last Visit:  Discussed the use of AI scribe software for clinical note transcription with the patient, who gave verbal consent to proceed.  History of Present Illness   The patient presents for medical weight management.  The patient is focusing on increasing protein intake, with sources including yogurt, beef, chicken, [place], and fried fish. Some proteins are prepared using oils and butters, contributing to increased calorie intake. She consumes cheese slices, particularly pepper jack, which may have added to her calorie intake due to fat content.  She is using Mounjaro for weight management and has a refill for the 5 mg dose, which she plans to pick up on Saturday. She is considering an increase in dosage to help manage portion sizes and cravings.  In terms of physical activity, she walks approximately two miles a day, including going up and down steps at school. She has not engaged in other forms of exercise but is considering  options to increase the intensity of her walks.       Challenges affecting patient progress: work schedule, having difficulty with meal prep and planning, and having difficulty focusing on healthy eating.   Orexigenic Control: Denies problems with appetite and hunger signals.  Denies problems with satiety and satiation.  Denies problems with eating patterns and portion control.  Denies abnormal cravings. Denies feeling deprived or restricted.   Pharmacotherapy for weight management: She is currently taking Monjauro with diabetes as the primary indication with adequate clinical response  and without side effects..   Assessment and Plan   Treatment Plan For Obesity:  Recommended Dietary Goals  Kendra Saunders is currently in the action stage of change. As such, her goal is to continue weight management plan. She has agreed to: continue current plan  Behavioral Health and Counseling  We discussed the following behavioral modification strategies today: continue to work on maintaining a reduced calorie state, getting the recommended amount of protein, incorporating whole foods, making healthy choices, staying well hydrated and practicing mindfulness when eating..  Additional education and resources provided today: Provided with personal guidance and instructions on how to use [employer] for healthy meal ideas and cooking in bulk.  Recommended Physical Activity Goals  Kendra Saunders has been advised to work up to 150 minutes of moderate intensity aerobic activity a week and strengthening exercises 2-3 times per week for cardiovascular health, weight loss maintenance and preservation of muscle mass.   She has agreed to :  Think about enjoyable ways to increase daily physical activity and overcoming barriers to exercise and Increase physical activity in their day and reduce sedentary time (increase NEAT).  Pharmacotherapy  We discussed various medication options to help Kendra Saunders with her weight  loss efforts and we both agreed to : increase Mounjaro to 7.5 mg once a week  Associated Conditions Impacted by Obesity Treatment  Class 1 obesity with serious comorbidity and body mass index (BMI) of 30.0 to 30.9 in adult, unspecified obesity type Assessment & Plan: See obesity treatment plan   Type 2 diabetes mellitus with other specified complication, without long-term current use of insulin (HCC) Assessment & Plan: Her most recent A1c is 6.3.  She is currently on metformin 500 mg once a day and Mounjaro.  Kendra Saunders will be increased to 7.5 mg once a week.  Continue with medically supervised weight management plan  Orders: -     Tirzepatide; Inject 7.5 mg into the skin once a week.  Dispense: 2 mL; Refill: 0  Abnormal food appetite Assessment & Plan: Improved on GLP-1.  She had increased orixegenic signaling, impaired satiety and inhibitory control. This is secondary to an abnormal energy regulation system and pathological neurohormonal pathways characteristic of excess adiposity.  In addition to nutritional and behavioral strategies she benefits from ongoing pharmacotherapy.     Abnormal metabolism Assessment & Plan: Patient has a slower than predicted metabolism. IC 1498 vs. calculated 1867. This may contribute to weight gain, chronic fatigue and difficulty losing weight.  We reviewed measures to improve metabolism including not skipping meals, progressive strengthening exercises, increasing protein intake at every meal and maintaining adequate hydration and sleep.         Objective   Physical Exam:  Blood pressure 121/79, pulse 84, temperature 98.3 F (36.8 C), height 5\' 9"  (1.753 m), weight 194 lb (88 kg), last menstrual period 05/08/2017, SpO2 100%. Body mass index is 28.65 kg/m.  General: She is overweight, cooperative, alert, well developed, and in no acute distress. PSYCH: Has normal mood, affect and thought process.   HEENT: EOMI, sclerae are anicteric. Lungs:  Normal breathing effort, no conversational dyspnea. Extremities: No edema.  Neurologic: No gross sensory or motor deficits. No tremors or fasciculations noted.    Diagnostic Data Reviewed:  BMET    Component Value Date/Time   NA 140 07/08/2023 0857   K 4.7 07/08/2023 0857   CL 102 07/08/2023 0857   CO2 22 07/08/2023 0857   GLUCOSE 87 07/08/2023 0857   GLUCOSE 90 06/25/2016 1229   BUN 10 07/08/2023 0857   CREATININE 0.94 07/08/2023 0857   CREATININE 0.87 06/25/2016 1229   CALCIUM 9.9 07/08/2023 0857   GFRNONAA 81 07/01/2018 0850   GFRNONAA 77 10/19/2014 0954   GFRAA 93 07/01/2018 0850   GFRAA 89 10/19/2014 0954   Lab Results  Component Value Date   HGBA1C 6.3 (H) 07/08/2023   HGBA1C 5.7 (H) 10/19/2014   Lab Results  Component Value Date   INSULIN 18.6 07/08/2023   Lab Results  Component Value Date   TSH 1.960 12/29/2017   CBC    Component Value Date/Time   WBC 5.8 07/01/2018 0850   WBC 4.1 06/25/2016 1229   RBC 4.91 07/01/2018 0850   RBC 4.64 06/25/2016 1229   HGB 13.5 07/01/2018 0850   HCT 41.1 07/01/2018 0850   PLT 281 07/01/2018 0850   MCV 84 07/01/2018 0850   MCH 27.5 07/01/2018 0850   MCH 27.6 06/25/2016 1229   MCHC 32.8  07/01/2018 0850   MCHC 34.1 06/25/2016 1229   RDW 14.8 07/01/2018 0850   Iron Studies    Component Value Date/Time   IRON 118 06/13/2014 1121   TIBC 352 06/13/2014 1121   IRONPCTSAT 34 06/13/2014 1121   Lipid Panel     Component Value Date/Time   CHOL 138 07/08/2023 0857   TRIG 131 07/08/2023 0857   TRIG 108 12/01/2006 1058   HDL 36 (L) 07/08/2023 0857   CHOLHDL 6.5 (H) 07/01/2018 0919   CHOLHDL 5.0 10/19/2014 0954   VLDL 37 10/19/2014 0954   LDLCALC 79 07/08/2023 0857   Hepatic Function Panel     Component Value Date/Time   PROT 7.6 07/08/2023 0857   ALBUMIN 4.9 07/08/2023 0857   AST 18 07/08/2023 0857   ALT 20 07/08/2023 0857   ALKPHOS 53 07/08/2023 0857   BILITOT 0.4 07/08/2023 0857      Component Value  Date/Time   TSH 1.960 12/29/2017 1814   Nutritional Lab Results  Component Value Date   VD25OH 58.2 07/08/2023   VD25OH 38.1 12/03/2016   VD25OH 28 (L) 06/25/2016    Follow-Up   Return in about 4 weeks (around 02/09/2024) for For Weight Mangement with Dr. Rikki Spearing.Marland Kitchen She was informed of the importance of frequent follow up visits to maximize her success with intensive lifestyle modifications for her multiple health conditions.  Attestation Statement   Reviewed by clinician on day of visit: allergies, medications, problem list, medical history, surgical history, family history, social history, and previous encounter notes.     Worthy Rancher, MD

## 2024-01-12 NOTE — Assessment & Plan Note (Signed)
 Improved on GLP-1.  She had increased orixegenic signaling, impaired satiety and inhibitory control. This is secondary to an abnormal energy regulation system and pathological neurohormonal pathways characteristic of excess adiposity.  In addition to nutritional and behavioral strategies she benefits from ongoing pharmacotherapy.

## 2024-01-12 NOTE — Assessment & Plan Note (Signed)
Her most recent A1c is 6.3.  She is currently on metformin 500 mg once a day and Mounjaro.  Greggory Keen will be increased to 7.5 mg once a week.  Continue with medically supervised weight management plan

## 2024-01-12 NOTE — Assessment & Plan Note (Signed)
 See obesity treatment plan

## 2024-01-12 NOTE — Assessment & Plan Note (Signed)
Patient has a slower than predicted metabolism. IC 1498 vs. calculated 1867. This may contribute to weight gain, chronic fatigue and difficulty losing weight.  We reviewed measures to improve metabolism including not skipping meals, progressive strengthening exercises, increasing protein intake at every meal and maintaining adequate hydration and sleep.

## 2024-01-13 ENCOUNTER — Other Ambulatory Visit (INDEPENDENT_AMBULATORY_CARE_PROVIDER_SITE_OTHER): Payer: Self-pay

## 2024-01-13 ENCOUNTER — Encounter (INDEPENDENT_AMBULATORY_CARE_PROVIDER_SITE_OTHER): Payer: Self-pay | Admitting: Internal Medicine

## 2024-01-13 ENCOUNTER — Other Ambulatory Visit (HOSPITAL_BASED_OUTPATIENT_CLINIC_OR_DEPARTMENT_OTHER): Payer: Self-pay

## 2024-01-13 DIAGNOSIS — E1169 Type 2 diabetes mellitus with other specified complication: Secondary | ICD-10-CM

## 2024-01-13 MED ORDER — TIRZEPATIDE 7.5 MG/0.5ML ~~LOC~~ SOAJ
7.5000 mg | SUBCUTANEOUS | 0 refills | Status: DC
  Filled 2024-01-13: qty 2, 28d supply, fill #0

## 2024-02-10 ENCOUNTER — Other Ambulatory Visit (HOSPITAL_BASED_OUTPATIENT_CLINIC_OR_DEPARTMENT_OTHER): Payer: Self-pay

## 2024-02-10 ENCOUNTER — Encounter (INDEPENDENT_AMBULATORY_CARE_PROVIDER_SITE_OTHER): Payer: Self-pay | Admitting: Internal Medicine

## 2024-02-10 ENCOUNTER — Ambulatory Visit (INDEPENDENT_AMBULATORY_CARE_PROVIDER_SITE_OTHER): Payer: 59 | Admitting: Internal Medicine

## 2024-02-10 VITALS — BP 116/73 | HR 76 | Temp 98.1°F | Ht 69.0 in | Wt 190.0 lb

## 2024-02-10 DIAGNOSIS — G8929 Other chronic pain: Secondary | ICD-10-CM | POA: Diagnosis not present

## 2024-02-10 DIAGNOSIS — M79674 Pain in right toe(s): Secondary | ICD-10-CM

## 2024-02-10 DIAGNOSIS — Z7985 Long-term (current) use of injectable non-insulin antidiabetic drugs: Secondary | ICD-10-CM

## 2024-02-10 DIAGNOSIS — Z6830 Body mass index (BMI) 30.0-30.9, adult: Secondary | ICD-10-CM

## 2024-02-10 DIAGNOSIS — E66811 Obesity, class 1: Secondary | ICD-10-CM | POA: Diagnosis not present

## 2024-02-10 DIAGNOSIS — E1169 Type 2 diabetes mellitus with other specified complication: Secondary | ICD-10-CM

## 2024-02-10 MED ORDER — TIRZEPATIDE 7.5 MG/0.5ML ~~LOC~~ SOAJ
7.5000 mg | SUBCUTANEOUS | 0 refills | Status: DC
  Filled 2024-02-10: qty 2, 28d supply, fill #0

## 2024-02-10 NOTE — Progress Notes (Unsigned)
 Office: [phone]  /  Fax: [phone]  Weight Summary And Biometrics  Vitals Temp: 98.1 F (36.7 C) BP: 116/73 Pulse Rate: 76 SpO2: 99 %   Anthropometric Measurements Height: 5\' 9"  (1.753 m) Weight: 190 lb (86.2 kg) BMI (Calculated): 28.05 Weight at Last Visit: 194 lb Weight Lost Since Last Visit: 4 lb Weight Gained Since Last Visit: 0 Starting Weight: 208 lb Total Weight Loss (lbs): 18 lb (8.165 kg) Peak Weight: 217 lb   Body Composition  Body Fat %: 32.9 % Fat Mass (lbs): 62.8 lbs Muscle Mass (lbs): 121.4 lbs Total Body Water (lbs): 81.8 lbs Visceral Fat Rating : 8    RMR: 1498  Today's Visit #: 9  Starting Date: 07/08/23   Subjective   Chief Complaint: Obesity  Interval History Discussed the use of AI scribe software for clinical note transcription with the patient, who gave verbal consent to proceed.  History of Present Illness            Challenges affecting patient progress: {EMOBESITYBARRIERS:28841}.    Pharmacotherapy for weight management: She is currently taking {EMPharmaco:28845}.   Assessment and Plan   Treatment Plan For Obesity:  Recommended Dietary Goals  Zyairah is currently in the action stage of change. As such, her goal is to continue weight management plan. She has agreed to: {EMWTLOSSPLAN:29297::"continue current plan"}  Behavioral Health and Counseling  We discussed the following behavioral modification strategies today: {EMWMwtlossstrategies:28914::"continue to work on maintaining a reduced calorie state, getting the recommended amount of protein, incorporating whole foods, making healthy choices, staying well hydrated and practicing mindfulness when eating."}.  Additional education and resources provided today: {EMadditionalresources:29169::"None"}  Recommended Physical Activity Goals  Valjean has been advised to work up to 150 minutes of moderate intensity aerobic activity a week and strengthening exercises  2-3 times per week for cardiovascular health, weight loss maintenance and preservation of muscle mass.   She has agreed to :  {EMEXERCISE:28847::"Think about enjoyable ways to increase daily physical activity and overcoming barriers to exercise","Increase physical activity in their day and reduce sedentary time (increase NEAT)."}  Pharmacotherapy  We discussed various medication options to help Myca with her weight loss efforts and we both agreed to : {EMagreedrx:29170}  Associated Conditions Impacted by Obesity Treatment  Type 2 diabetes mellitus with other specified complication, without long-term current use of insulin (HCC) -     Tirzepatide; Inject 7.5 mg into the skin once a week.  Dispense: 2 mL; Refill: 0    Assessment and Plan               Objective   Physical Exam:  Blood pressure 116/73, pulse 76, temperature 98.1 F (36.7 C), height 5\' 9"  (1.753 m), weight 190 lb (86.2 kg), last menstrual period 05/08/2017, SpO2 99%. Body mass index is 28.06 kg/m.  General: She is overweight, cooperative, alert, well developed, and in no acute distress. PSYCH: Has normal mood, affect and thought process.   HEENT: EOMI, sclerae are anicteric. Lungs: Normal breathing effort, no conversational dyspnea. Extremities: No edema.  Neurologic: No gross sensory or motor deficits. No tremors or fasciculations noted.    Diagnostic Data Reviewed:  BMET    Component Value Date/Time   NA 140 07/08/2023 0857   K 4.7 07/08/2023 0857   CL 102 07/08/2023 0857   CO2 22 07/08/2023 0857   GLUCOSE 87 07/08/2023 0857   GLUCOSE 90 06/25/2016 1229   BUN 10 07/08/2023 0857   CREATININE 0.94 07/08/2023 0857   CREATININE 0.87  06/25/2016 1229   CALCIUM 9.9 07/08/2023 0857   GFRNONAA 81 07/01/2018 0850   GFRNONAA 77 10/19/2014 0954   GFRAA 93 07/01/2018 0850   GFRAA 89 10/19/2014 0954   Lab Results  Component Value Date   HGBA1C 6.3 (H) 07/08/2023   HGBA1C 5.7 (H) 10/19/2014   Lab  Results  Component Value Date   INSULIN 18.6 07/08/2023   Lab Results  Component Value Date   TSH 1.960 12/29/2017   CBC    Component Value Date/Time   WBC 5.8 07/01/2018 0850   WBC 4.1 06/25/2016 1229   RBC 4.91 07/01/2018 0850   RBC 4.64 06/25/2016 1229   HGB 13.5 07/01/2018 0850   HCT 41.1 07/01/2018 0850   PLT 281 07/01/2018 0850   MCV 84 07/01/2018 0850   MCH 27.5 07/01/2018 0850   MCH 27.6 06/25/2016 1229   MCHC 32.8 07/01/2018 0850   MCHC 34.1 06/25/2016 1229   RDW 14.8 07/01/2018 0850   Iron Studies    Component Value Date/Time   IRON 118 06/13/2014 1121   TIBC 352 06/13/2014 1121   IRONPCTSAT 34 06/13/2014 1121   Lipid Panel     Component Value Date/Time   CHOL 138 07/08/2023 0857   TRIG 131 07/08/2023 0857   TRIG 108 12/01/2006 1058   HDL 36 (L) 07/08/2023 0857   CHOLHDL 6.5 (H) 07/01/2018 0919   CHOLHDL 5.0 10/19/2014 0954   VLDL 37 10/19/2014 0954   LDLCALC 79 07/08/2023 0857   Hepatic Function Panel     Component Value Date/Time   PROT 7.6 07/08/2023 0857   ALBUMIN 4.9 07/08/2023 0857   AST 18 07/08/2023 0857   ALT 20 07/08/2023 0857   ALKPHOS 53 07/08/2023 0857   BILITOT 0.4 07/08/2023 0857      Component Value Date/Time   TSH 1.960 12/29/2017 1814   Nutritional Lab Results  Component Value Date   VD25OH 58.2 07/08/2023   VD25OH 38.1 12/03/2016   VD25OH 28 (L) 06/25/2016    Medications: Outpatient Encounter Medications as of 02/10/2024  Medication Sig   FLUoxetine (PROZAC) 20 MG tablet TAKE 2 TABLETS(40 MG) BY MOUTH DAILY   metFORMIN (GLUCOPHAGE) 500 MG tablet Take 500 mg by mouth 2 (two) times daily with a meal.   olmesartan (BENICAR) 20 MG tablet Take 20 mg by mouth daily.   ondansetron (ZOFRAN) 4 MG tablet Take 1 tablet (4 mg total) by mouth every 8 (eight) hours as needed for nausea or vomiting.   rizatriptan (MAXALT-MLT) 10 MG disintegrating tablet Take 1 tablet (10 mg total) by mouth as needed for migraine. May repeat in 2  hours if needed   rosuvastatin (CRESTOR) 10 MG tablet Take 10 mg by mouth daily.   [DISCONTINUED] tirzepatide (MOUNJARO) 7.5 MG/0.5ML Pen Inject 7.5 mg into the skin once a week.   tirzepatide (MOUNJARO) 7.5 MG/0.5ML Pen Inject 7.5 mg into the skin once a week.   Facility-Administered Encounter Medications as of 02/10/2024  Medication   0.9 %  sodium chloride infusion     Follow-Up   Return in about 4 weeks (around 03/09/2024) for For Weight Mangement with Dr. Rikki Spearing.Marland Kitchen She was informed of the importance of frequent follow up visits to maximize her success with intensive lifestyle modifications for her multiple health conditions.  Attestation Statement   Reviewed by clinician on day of visit: allergies, medications, problem list, medical history, surgical history, family history, social history, and previous encounter notes.     Worthy Rancher, MD

## 2024-02-12 DIAGNOSIS — M79674 Pain in right toe(s): Secondary | ICD-10-CM | POA: Insufficient documentation

## 2024-02-12 NOTE — Assessment & Plan Note (Signed)
 Reports excruciating throbbing pain in her baby toe following surgery in July. Pain has been persistent for the past two to three weeks and is exacerbated by wearing shoes. Has an appointment with a foot specialist at the end of March but is experiencing significant discomfort. - Advise to call the foot specialist's office to request an earlier appointment due to severe pain - Recommend avoiding activities that exacerbate the pain

## 2024-02-12 NOTE — Assessment & Plan Note (Signed)
 Recent A1c of 6.3%, within target range. Managing diabetes well with current medications and lifestyle modifications. Follow-up appointment with primary care physician this month for blood work. Discussed the importance of a balanced diet with an emphasis on fruits, vegetables, and lean proteins to maintain diabetes control. - Continue current diabetes management plan inclusive of Mounjaro 7.5 mg once a week - Ensure follow-up with primary care physician for blood work - Encourage continued adherence to a balanced diet with an emphasis on fruits, vegetables, and lean proteins

## 2024-02-12 NOTE — Assessment & Plan Note (Signed)
 Currently on a medical weight management plan. Since the last visit, she has lost four pounds and reports following the category two plan fifty percent of the time. Not tracking calories but is eating more whole foods and infrequently consuming processed foods. Walking about seven thousand steps daily but not engaging in other forms of exercise. Body fat percentage has decreased from 34% to 32%. Visceral fat is down to 8. BMI is 28. Discussed the importance of protein intake and physical activity for weight management and diabetes control. Recommended chair exercises to accommodate her foot pain. - Continue current weight management plan - Encourage tracking of calories and protein intake - Recommend increasing protein intake to at least three servings per day, about thirty grams per serving - Suggest incorporating chair exercises to increase physical activity - Maintain current Mounjaro dosage at 7.5 mg

## 2024-03-01 ENCOUNTER — Ambulatory Visit (INDEPENDENT_AMBULATORY_CARE_PROVIDER_SITE_OTHER): Admitting: Podiatry

## 2024-03-01 ENCOUNTER — Ambulatory Visit (INDEPENDENT_AMBULATORY_CARE_PROVIDER_SITE_OTHER)

## 2024-03-01 ENCOUNTER — Encounter: Payer: Self-pay | Admitting: Podiatry

## 2024-03-01 DIAGNOSIS — L03031 Cellulitis of right toe: Secondary | ICD-10-CM | POA: Diagnosis not present

## 2024-03-01 DIAGNOSIS — M898X7 Other specified disorders of bone, ankle and foot: Secondary | ICD-10-CM

## 2024-03-01 NOTE — Progress Notes (Signed)
  Subjective:  Patient ID: Kendra Saunders, female    DOB: 1965/12/31,   MRN: [id number]  No chief complaint on file.   58 y.o. female presents for concern of return of pain in her right fifth toe. Relates this is the surgery she has already had worked on twice and now the pain has returned again. Patient relates wearing her church shoes and heels has been causing a lot of pain. Relates now the pain is more on the inside of the toe and rubbing up against the fourth toe . Denies any other pedal complaints. Denies n/v/f/c.   Past Medical History:  Diagnosis Date   Anxiety    Asthma    childhood; last Albuterol use elementary school.   Depression    Diabetes mellitus without complication (HCC)    High cholesterol    Hypertension    Migraine    Mitral valve prolapse    Seasonal allergies    Sickle cell trait (HCC)     Objective:  Physical Exam: Vascular: DP/PT pulses 2/4 bilateral. CFT <3 seconds. Normal hair growth on digits. No edema.  Skin. No lacerations or abrasions bilateral feet.  Musculoskeletal: MMT 5/5 bilateral lower extremities in DF, PF, Inversion and Eversion. Deceased ROM in DF of ankle joint. Tender to medial fifth digit PIPJ. With palpable bone underlying area and tender to palpation.  Neurological: Sensation intact to light touch.   Assessment:   1. Exostosis of toe      Plan:  Patient was evaluated and treated and all questions answered. -X-rays reviewed. Prominence noted to medial proximal phalanx of fifth digit that could be causing rubbing and correlate to pain.  -Educated on hammertoes and prominence of bones and treatment options  -Discussed padding including toe caps and crest pads.  -Discussed need for potential surgery given patient has tried conservative treatments and still been acting up. Hoping to have another surgery to correct.  Discussed surgical correction and exostectomy of prominent bone in fifth digit. Discussed perioperative course.   -Informed surgical risk consent was reviewed and read aloud to the patient.  I reviewed the films.  I have discussed my findings with the patient in great detail.  I have discussed all risks including but not limited to infection, stiffness, scarring, limp, disability, deformity, damage to blood vessels and nerves, numbness, poor healing, need for braces, arthritis, chronic pain, amputation, death.  All benefits and realistic expectations discussed in great detail.  I have made no promises as to the outcome.  I have provided realistic expectations.  I have offered the patient a 2nd opinion, which they have declined and assured me they preferred to proceed despite the risks.    Louann Sjogren, DPM

## 2024-03-02 ENCOUNTER — Ambulatory Visit: Payer: 59 | Admitting: Podiatry

## 2024-03-02 ENCOUNTER — Encounter: Payer: Self-pay | Admitting: Podiatry

## 2024-03-03 ENCOUNTER — Telehealth: Payer: Self-pay | Admitting: Podiatry

## 2024-03-03 NOTE — Telephone Encounter (Signed)
 Pt sent my chart message about where surgery was and if someone would be contacting her with time as well.  I called pt and explained the surgery will be a [facility] and the address should be in the bag she got in the office.  As far as time I explained they would be calling pt day before surgery to let her know what time she needed to arrive. She said thank you

## 2024-03-10 ENCOUNTER — Encounter (INDEPENDENT_AMBULATORY_CARE_PROVIDER_SITE_OTHER): Payer: Self-pay | Admitting: Internal Medicine

## 2024-03-10 ENCOUNTER — Encounter (INDEPENDENT_AMBULATORY_CARE_PROVIDER_SITE_OTHER): Payer: Self-pay | Admitting: Family Medicine

## 2024-03-10 ENCOUNTER — Ambulatory Visit (INDEPENDENT_AMBULATORY_CARE_PROVIDER_SITE_OTHER): Admitting: Family Medicine

## 2024-03-10 ENCOUNTER — Other Ambulatory Visit (HOSPITAL_BASED_OUTPATIENT_CLINIC_OR_DEPARTMENT_OTHER): Payer: Self-pay

## 2024-03-10 VITALS — BP 105/71 | HR 80 | Temp 98.0°F | Ht 69.0 in | Wt 188.0 lb

## 2024-03-10 DIAGNOSIS — E1159 Type 2 diabetes mellitus with other circulatory complications: Secondary | ICD-10-CM

## 2024-03-10 DIAGNOSIS — I152 Hypertension secondary to endocrine disorders: Secondary | ICD-10-CM

## 2024-03-10 DIAGNOSIS — Z7985 Long-term (current) use of injectable non-insulin antidiabetic drugs: Secondary | ICD-10-CM

## 2024-03-10 DIAGNOSIS — Z7984 Long term (current) use of oral hypoglycemic drugs: Secondary | ICD-10-CM

## 2024-03-10 DIAGNOSIS — E782 Mixed hyperlipidemia: Secondary | ICD-10-CM

## 2024-03-10 DIAGNOSIS — E66811 Obesity, class 1: Secondary | ICD-10-CM

## 2024-03-10 DIAGNOSIS — E1169 Type 2 diabetes mellitus with other specified complication: Secondary | ICD-10-CM

## 2024-03-10 DIAGNOSIS — Z6831 Body mass index (BMI) 31.0-31.9, adult: Secondary | ICD-10-CM

## 2024-03-10 DIAGNOSIS — E559 Vitamin D deficiency, unspecified: Secondary | ICD-10-CM

## 2024-03-10 DIAGNOSIS — Z6827 Body mass index (BMI) 27.0-27.9, adult: Secondary | ICD-10-CM

## 2024-03-10 MED ORDER — TIRZEPATIDE 7.5 MG/0.5ML ~~LOC~~ SOAJ
7.5000 mg | SUBCUTANEOUS | 0 refills | Status: DC
  Filled 2024-03-10: qty 2, 28d supply, fill #0

## 2024-03-10 NOTE — Progress Notes (Signed)
 Kendra Saunders, D.O.  ABFM, ABOM Specializing in Clinical Bariatric Medicine  Office located at: [address] [place], [place]  [address]   Assessment and Plan:  No orders of the defined types were placed in this encounter.  Medications Discontinued During This Encounter  Medication Reason   tirzepatide [facility]) 7.5 MG/0.5ML Pen Reorder    Meds ordered this encounter  Medications   tirzepatide (MOUNJARO) 7.5 MG/0.5ML Pen    Sig: Inject 7.5 mg into the skin once a week.    Dispense:  2 mL    Refill:  0     FOR THE DISEASE OF OBESITY:  BMI 27.0-27.9,adult - CURRENT 27.75 Class 1 obesity with serious comorbidity and body mass index (BMI) of 30.0 to 30.9 in adult, unspecified obesity type Assessment & Plan: Since last office visit on 02/10/2024, patient's muscle mass has decreased by 0.8 lbs. Fat mass has decreased by 1.4 lbs. Total body water has decreased by 1.4 lbs.  Counseling done on how various foods will affect these numbers and how to maximize success  Total lbs lost to date: 20 lbs Total weight loss percentage to date: 9.62%    Recommended Dietary Goals Kendra Saunders is currently in the action stage of change. As such, her goal is to continue weight management plan.  She has agreed to: continue current plan   Behavioral Intervention We discussed the following today: increasing lean protein intake to established goals, decreasing simple carbohydrates , increasing water intake , and continue to work on implementation of reduced calorie nutritional plan  Additional resources provided today: None  Evidence-based interventions for health behavior change were utilized today including the discussion of self monitoring techniques, problem-solving barriers and SMART goal setting techniques.   Regarding patient's less desirable eating habits and patterns, we employed the technique of small changes.   Pt will specifically work on: Increase water intake with goal of 5  bottles daily for next visit.    Recommended Physical Activity Goals Kendra Saunders has been advised to work up to 150 minutes of moderate intensity aerobic activity a week and strengthening exercises 2-3 times per week for cardiovascular health, weight loss maintenance and preservation of muscle mass.   She has agreed to :  Think about enjoyable ways to increase daily physical activity and overcoming barriers to exercise   Pharmacotherapy We both agreed to : continue with nutritional and behavioral strategies and current medication regimen   FOR ASSOCIATED CONDITIONS ADDRESSED TODAY:  Type 2 diabetes mellitus with other specified complication, without long-term current use of insulin [facility]) Assessment & Plan: Lab Results  Component Value Date   HGBA1C 6.0 (H) 03/02/2024   Lab Results  Component Value Date   CREATININE 1.00 03/02/2024   BUN 12 03/02/2024   NA 137 03/02/2024   K 4.4 03/02/2024   CL 102 03/02/2024   CO2 24 03/02/2024      Component Value Date   PROT 8.1 03/02/2024   ALBUMIN 4.6 03/02/2024   AST 22 03/02/2024   ALT 20 03/02/2024   ALKPHOS 35 03/02/2024   BILITOT 1.2 03/02/2024   Lab Results  Component Value Date   WBC 4.4 03/02/2024   HGB 12.5 03/02/2024   HCT 37.9 03/02/2024   MCV 82 03/02/2024   PLT 280 03/02/2024   Lab Results  Component Value Date   TSH 1.29 03/02/2024  Kendra Saunders is taking Metformin 500 mg once daily and Mounjaro 7.5 mg once weekly. Tolerating both medications well, denies any N/V/D. Pt  endorses feeling a decrease in appetite with Mounjaro dose. Kendra Saunders asked to review labs from 8 days ago done by PCP. Pts A1c has improved from 6.3 to 6.0. Thyroid, liver, CBC, and kidney function all WNL.   Continue working on implementation on prudent nutritional plan and increase daily exercise to further improve A1c. Encouraged pt to increase water intake with a goal of half of body weight in ounces. Continue current medication regimen. Will continue to  monitor condition.   Relevant Orders: -     Tirzepatide; Inject 7.5 mg into the skin once a week.  Dispense: 2 mL; Refill: 0   Mixed diabetic hyperlipidemia associated with type 2 diabetes mellitus [facility]) Assessment & Plan: Last lipids Lab Results  Component Value Date   CHOL 131 03/02/2024   HDL 35 (L) 03/02/2024   LDLCALC 75 (H) 03/02/2024   TRIG 103 03/02/2024  Kendra Saunders is on Crestor 10 mg daily. Reviewed labs done by PCP, LDL was slightly elevated at 75 and HDL was below goal at 35. Cardiovascular risk and specific lipid/LDL goals reviewed. I stressed the importance that patient continue with our prudent nutritional plan that is low in saturated and trans fats, and low in fatty carbs to improve these numbers. Encouraged pt to visit www.heart.org for more information on cholesterol. Will monitor condition alongside PCP.    Hypertension associated with diabetes [facility]) Assessment & Plan: BP Readings from Last 3 Encounters:  03/10/24 105/71  02/10/24 116/73  01/12/24 121/79  Kendra Saunders is taking Benicar 20 mg daily. Condition well controlled, pt presented with stable BP. No concerns today in this regard. Continue current medication regimen. Will continue to monitor.    Vitamin D deficiency Assessment & Plan: Last vitamin D Lab Results  Component Value Date   VD25OH 48 03/02/2024  Pt is currently taking OTC Vit D supplement but unsure of the exact dose. She is tolerating the supplement well, denies any adverse SE. Per labs obtained by PCP, Vit D was WNL at 48. Encouraged pt to bring in supplement dose to next appointment. Will continue to monitor as it pertains to her weight loss journey.   Follow up:   Return in about 6 weeks (around 04/22/2024). She was informed of the importance of frequent follow up visits to maximize her success with intensive lifestyle modifications for her multiple health conditions.  Subjective:   Chief complaint: Obesity Kendra Saunders is here to discuss her  progress with her obesity treatment plan. She is on the the Category 2 Plan and states she is following her eating plan approximately 50% of the time. She states she is not exercising.   Interval History:  Kendra Saunders is here for a follow up office visit. Since last OV on 02/10/2024, pt is up 2 lbs. She is not measuring her foods at home, however pt endorses a decrease in her appetite and sometimes does not finish her meals. Additionally, pt does not usually exercise but walks a lot at work.   Pharmacotherapy for weight loss: She is currently taking Metformin 500 mg twice dsily and Mounjaro 7.5 mg once weekly.   Review of Systems:  Pertinent positives were addressed with patient today.  Reviewed by clinician on day of visit: allergies, medications, problem list, medical history, surgical history, family history, social history, and previous encounter notes.  Weight Summary and Biometrics   Weight Lost Since Last Visit: 2lb  Weight Gained Since Last Visit: 0   Vitals Temp: 98 F (36.7 C) BP: 105/71 Pulse Rate: 80  SpO2: 98 %   Anthropometric Measurements Height: 5\' 9"  (1.753 m) Weight: 188 lb (85.3 kg) BMI (Calculated): 27.75 Weight at Last Visit: 190lb Weight Lost Since Last Visit: 2lb Weight Gained Since Last Visit: 0 Starting Weight: 208lb Total Weight Loss (lbs): 20 lb (9.072 kg) Peak Weight: 217lb   Body Composition  Body Fat %: 32.6 % Fat Mass (lbs): 61.4 lbs Muscle Mass (lbs): 120.6 lbs Total Body Water (lbs): 80.4 lbs Visceral Fat Rating : 8   Other Clinical Data Fasting: no Labs: no Today's Visit #: 9 Starting Date: 07/08/23    Objective:   PHYSICAL EXAM: Blood pressure 105/71, pulse 80, temperature 98 F (36.7 C), height 5\' 9"  (1.753 m), weight 188 lb (85.3 kg), last menstrual period 05/08/2017, SpO2 98%. Body mass index is 27.76 kg/m.  General: she is overweight, cooperative and in no acute distress. PSYCH: Has normal mood, affect and  thought process.   HEENT: EOMI, sclerae are anicteric. Lungs: Normal breathing effort, no conversational dyspnea. Extremities: Moves * 4 Neurologic: A and O * 3, good insight  DIAGNOSTIC DATA REVIEWED: BMET    Component Value Date/Time   NA 140 07/08/2023 0857   K 4.7 07/08/2023 0857   CL 102 07/08/2023 0857   CO2 22 07/08/2023 0857   GLUCOSE 87 07/08/2023 0857   GLUCOSE 90 06/25/2016 1229   BUN 10 07/08/2023 0857   CREATININE 0.94 07/08/2023 0857   CREATININE 0.87 06/25/2016 1229   CALCIUM 9.9 07/08/2023 0857   GFRNONAA 81 07/01/2018 0850   GFRNONAA 77 10/19/2014 0954   GFRAA 93 07/01/2018 0850   GFRAA 89 10/19/2014 0954   Lab Results  Component Value Date   HGBA1C 6.3 (H) 07/08/2023   HGBA1C 5.7 (H) 10/19/2014   Lab Results  Component Value Date   INSULIN 18.6 07/08/2023   Lab Results  Component Value Date   TSH 1.960 12/29/2017   CBC    Component Value Date/Time   WBC 5.8 07/01/2018 0850   WBC 4.1 06/25/2016 1229   RBC 4.91 07/01/2018 0850   RBC 4.64 06/25/2016 1229   HGB 13.5 07/01/2018 0850   HCT 41.1 07/01/2018 0850   PLT 281 07/01/2018 0850   MCV 84 07/01/2018 0850   MCH 27.5 07/01/2018 0850   MCH 27.6 06/25/2016 1229   MCHC 32.8 07/01/2018 0850   MCHC 34.1 06/25/2016 1229   RDW 14.8 07/01/2018 0850   Iron Studies    Component Value Date/Time   IRON 118 06/13/2014 1121   TIBC 352 06/13/2014 1121   IRONPCTSAT 34 06/13/2014 1121   Lipid Panel     Component Value Date/Time   CHOL 138 07/08/2023 0857   TRIG 131 07/08/2023 0857   TRIG 108 12/01/2006 1058   HDL 36 (L) 07/08/2023 0857   CHOLHDL 6.5 (H) 07/01/2018 0919   CHOLHDL 5.0 10/19/2014 0954   VLDL 37 10/19/2014 0954   LDLCALC 79 07/08/2023 0857   Hepatic Function Panel     Component Value Date/Time   PROT 7.6 07/08/2023 0857   ALBUMIN 4.9 07/08/2023 0857   AST 18 07/08/2023 0857   ALT 20 07/08/2023 0857   ALKPHOS 53 07/08/2023 0857   BILITOT 0.4 07/08/2023 0857      Component  Value Date/Time   TSH 1.960 12/29/2017 1814   Nutritional Lab Results  Component Value Date   VD25OH 58.2 07/08/2023   VD25OH 38.1 12/03/2016   VD25OH 28 (L) 06/25/2016    Attestations:   I, Camryn Mix, acting as  a medical scribe for Thomasene Lot, DO., have compiled all relevant documentation for today's office visit on behalf of Thomasene Lot, DO, while in the presence of [employer], DO.  I have reviewed the above documentation for accuracy and completeness, and I agree with the above. Kendra Saunders, D.O.  The 21st Century Cures Act was signed into law in 2016 which includes the topic of electronic health records.  This provides immediate access to information in MyChart.  This includes consultation notes, operative notes, office notes, lab results and pathology reports.  If you have any questions about what you read please let us know at your next visit so we can discuss your concerns and take corrective action if need be.  We are right here with you.

## 2024-03-11 ENCOUNTER — Telehealth: Payer: Self-pay | Admitting: Podiatry

## 2024-03-11 NOTE — Telephone Encounter (Signed)
 DOS: 03/30/24  (R) 5TH EXOSTECTOMY [phone])    EFFECTIVE DATE: 12/10/23  DEDUCTIBLE:  $1,250.00  REMAINING:  $1,250.00  OOP:  $4,890.00  REMAINING: $4,622.96   CO INSURANCE : 20%   PER THE [employer] AUTOM[employer] ASSISTANT NO PRIOR AUTH IS REQ FOR CPT CODE 91478   REF # [id number]

## 2024-03-24 ENCOUNTER — Encounter: Payer: Self-pay | Admitting: Podiatry

## 2024-03-30 ENCOUNTER — Other Ambulatory Visit: Payer: Self-pay | Admitting: Podiatry

## 2024-03-30 ENCOUNTER — Encounter: Payer: Self-pay | Admitting: Podiatry

## 2024-03-30 DIAGNOSIS — M25774 Osteophyte, right foot: Secondary | ICD-10-CM | POA: Diagnosis not present

## 2024-03-30 MED ORDER — ONDANSETRON HCL 4 MG PO TABS: 4.0000 mg | ORAL_TABLET | Freq: Three times a day (TID) | ORAL | 0 refills | Status: AC | PRN

## 2024-03-30 MED ORDER — FLUCONAZOLE 150 MG PO TABS: 150.0000 mg | ORAL_TABLET | Freq: Once | ORAL | 0 refills | Status: AC

## 2024-03-30 MED ORDER — OXYCODONE-ACETAMINOPHEN 5-325 MG PO TABS: 1.0000 | ORAL_TABLET | ORAL | 0 refills | Status: AC | PRN

## 2024-04-06 ENCOUNTER — Ambulatory Visit (INDEPENDENT_AMBULATORY_CARE_PROVIDER_SITE_OTHER): Admitting: Podiatry

## 2024-04-06 ENCOUNTER — Ambulatory Visit (INDEPENDENT_AMBULATORY_CARE_PROVIDER_SITE_OTHER)

## 2024-04-06 DIAGNOSIS — M898X7 Other specified disorders of bone, ankle and foot: Secondary | ICD-10-CM

## 2024-04-06 DIAGNOSIS — Z9889 Other specified postprocedural states: Secondary | ICD-10-CM

## 2024-04-06 NOTE — Progress Notes (Signed)
  Subjective:  Patient ID: Kendra Saunders, female    DOB: October 19, 1966,  MRN: [id number]  No chief complaint on file.   DOS: 03/30/24  Procedure: Right fifth digit exostectomy  59 y.o. female returns for POV#1. Relates doing well and managing pain   Review of Systems: Negative except as noted in the HPI. Denies N/V/F/Ch.  Past Medical History:  Diagnosis Date   Anxiety    Asthma    childhood; last Albuterol use elementary school.   Depression    Diabetes mellitus without complication (HCC)    High cholesterol    Hypertension    Migraine    Mitral valve prolapse    Seasonal allergies    Sickle cell trait (HCC)     Current Outpatient Medications:    FLUoxetine  (PROZAC ) 20 MG tablet, TAKE 2 TABLETS(40 MG) BY MOUTH DAILY, Disp: 60 tablet, Rfl: 5   metFORMIN (GLUCOPHAGE) 500 MG tablet, Take 500 mg by mouth 2 (two) times daily with a meal., Disp: , Rfl:    olmesartan (BENICAR) 20 MG tablet, Take 20 mg by mouth daily., Disp: , Rfl:    ondansetron  (ZOFRAN ) 4 MG tablet, Take 1 tablet (4 mg total) by mouth every 8 (eight) hours as needed for nausea or vomiting., Disp: 20 tablet, Rfl: 0   rizatriptan  (MAXALT -MLT) 10 MG disintegrating tablet, Take 1 tablet (10 mg total) by mouth as needed for migraine. May repeat in 2 hours if needed, Disp: 8 tablet, Rfl: 5   rosuvastatin (CRESTOR) 10 MG tablet, Take 10 mg by mouth daily., Disp: , Rfl:    tirzepatide  (MOUNJARO ) 7.5 MG/0.5ML Pen, Inject 7.5 mg into the skin once a week., Disp: 2 mL, Rfl: 0  Current Facility-Administered Medications:    0.9 %  sodium chloride  infusion, 500 mL, Intravenous, Continuous, Nandigam, Kavitha V, MD  Social History   Tobacco Use  Smoking Status Never  Smokeless Tobacco Never    Allergies  Allergen Reactions   Peanuts [Peanut Oil]     Throat itches and she has stomach pains   Objective:  There were no vitals filed for this visit. There is no height or weight on file to calculate BMI. Constitutional Well  developed. Well nourished.  Vascular Foot warm and well perfused. Capillary refill normal to all digits.   Neurologic Normal speech. Oriented to person, place, and time. Epicritic sensation to light touch grossly present bilaterally.  Dermatologic Skin healing well without signs of infection. Skin edges well coapted without signs of infection.  Orthopedic: Tenderness to palpation noted about the surgical site.   Radiographs: Toe well aligned and additional spurring resected Assessment:   1. Exostosis of toe    Plan:  Patient was evaluated and treated and all questions answered.  S/p foot surgery right -Progressing as expected post-operatively. -WB Status: WBAT  in surgical shoe -Sutures: intact. -Medications: n/a -Foot redressed.  Return in 2 weeks for suture removal.   No follow-ups on file.

## 2024-04-12 ENCOUNTER — Other Ambulatory Visit (INDEPENDENT_AMBULATORY_CARE_PROVIDER_SITE_OTHER): Payer: Self-pay | Admitting: Family Medicine

## 2024-04-12 DIAGNOSIS — E1169 Type 2 diabetes mellitus with other specified complication: Secondary | ICD-10-CM

## 2024-04-15 ENCOUNTER — Encounter (HOSPITAL_BASED_OUTPATIENT_CLINIC_OR_DEPARTMENT_OTHER): Payer: Self-pay

## 2024-04-15 ENCOUNTER — Other Ambulatory Visit (HOSPITAL_BASED_OUTPATIENT_CLINIC_OR_DEPARTMENT_OTHER): Payer: Self-pay

## 2024-04-20 ENCOUNTER — Ambulatory Visit (INDEPENDENT_AMBULATORY_CARE_PROVIDER_SITE_OTHER): Admitting: Podiatry

## 2024-04-20 ENCOUNTER — Encounter: Admitting: Podiatry

## 2024-04-20 DIAGNOSIS — M898X7 Other specified disorders of bone, ankle and foot: Secondary | ICD-10-CM

## 2024-04-20 NOTE — Progress Notes (Signed)
 She presents today for her second postop visit date of surgery 03/30/2024 with Dr. Sikora.  She had surgery on the fifth digit of the right foot.  She denies fever chills nausea vomiting muscle aches and pains states that she continues to wear her Darco shoe and that her foot does not hurt.  Objective: Vital signs are stable alert and oriented x 3.  The toe is mildly edematous sutures are intact margins well coapted sutures were removed today.  Margins remain well coapted I see no signs of infection.  Assessment: Well-healing surgical foot.  Well-healing surgical toe fifth right.  Plan: Recommended that she use Coban to wrap the toe on a daily basis to cover the foot at night while in bed so the toe does not get snagged on bed linens.  Also recommended to continue to wear the Darco shoe for about 2 more weeks and then discontinue she will follow-up with Dr. Sikora in the next few weeks.

## 2024-04-22 ENCOUNTER — Encounter (INDEPENDENT_AMBULATORY_CARE_PROVIDER_SITE_OTHER): Payer: Self-pay | Admitting: Family Medicine

## 2024-04-22 ENCOUNTER — Other Ambulatory Visit (HOSPITAL_BASED_OUTPATIENT_CLINIC_OR_DEPARTMENT_OTHER): Payer: Self-pay

## 2024-04-22 ENCOUNTER — Ambulatory Visit (INDEPENDENT_AMBULATORY_CARE_PROVIDER_SITE_OTHER): Admitting: Family Medicine

## 2024-04-22 VITALS — BP 110/71 | HR 79 | Temp 97.9°F | Ht 69.0 in | Wt 189.0 lb

## 2024-04-22 DIAGNOSIS — E559 Vitamin D deficiency, unspecified: Secondary | ICD-10-CM

## 2024-04-22 DIAGNOSIS — E1169 Type 2 diabetes mellitus with other specified complication: Secondary | ICD-10-CM

## 2024-04-22 DIAGNOSIS — Z6831 Body mass index (BMI) 31.0-31.9, adult: Secondary | ICD-10-CM

## 2024-04-22 DIAGNOSIS — Z6830 Body mass index (BMI) 30.0-30.9, adult: Secondary | ICD-10-CM

## 2024-04-22 DIAGNOSIS — Z6827 Body mass index (BMI) 27.0-27.9, adult: Secondary | ICD-10-CM

## 2024-04-22 DIAGNOSIS — Z7984 Long term (current) use of oral hypoglycemic drugs: Secondary | ICD-10-CM

## 2024-04-22 DIAGNOSIS — E66811 Obesity, class 1: Secondary | ICD-10-CM

## 2024-04-22 DIAGNOSIS — Z7985 Long-term (current) use of injectable non-insulin antidiabetic drugs: Secondary | ICD-10-CM

## 2024-04-22 MED ORDER — TIRZEPATIDE 10 MG/0.5ML ~~LOC~~ SOAJ
10.0000 mg | SUBCUTANEOUS | 0 refills | Status: AC
Start: 2024-04-22 — End: ?
  Filled 2024-04-22: qty 2, 28d supply, fill #0

## 2024-04-22 NOTE — Progress Notes (Signed)
 Kendra Saunders, D.O.  ABFM, ABOM Specializing in Clinical Bariatric Medicine  Office located at: [address] [place], [place]  [address]   Assessment and Plan:   Medications Discontinued During This Encounter  Medication Reason   tirzepatide  (MOUNJARO ) 7.5 MG/0.5ML Pen Dose change    Meds ordered this encounter  Medications   tirzepatide  (MOUNJARO ) 10 MG/0.5ML Pen    Sig: Inject 10 mg into the skin once a week.    Dispense:  2 mL    Refill:  0    FOR THE DISEASE OF OBESITY:  BMI 27.0-27.9,adult - CURRENT 27.9 Class 1 obesity with serious comorbidity and body mass index (BMI) of 30.0 to 30.9 in adult, unspecified obesity type Assessment & Plan: Since last office visit on 03/10/2024 patient's  Muscle mass has decreased by 4.8 lb. Fat mass has increased by 5.6 lb. Total body water has increased by 5.6 lb.  Counseling done on how various foods will affect these numbers and how to maximize success  Total lbs lost to date: 19 lbs Total weight loss percentage to date: 9.13%    Recommended Dietary Goals Kendra Saunders is currently in the action stage of change. As such, her goal is to continue weight management plan.  She has agreed to: continue the CAT 2 MP; today pt was also provided the option to journal 1100 calories and 85 grams protein daily    Behavioral Intervention We discussed the following today: increasing lean protein intake to established goals, decreasing simple carbohydrates , and work on tracking and journaling calories using tracking application  Additional resources provided today: Handout on CAT 2 meal plan and Handout on Daily Food Journaling Log  Evidence-based interventions for health behavior change were utilized today including the discussion of self monitoring techniques, problem-solving barriers and SMART goal setting techniques.   Regarding patient's less desirable eating habits and patterns, we employed the technique of small changes.   Pt will  specifically work on increasing her protein intake and increasing her water intake to 5 bottles daily.    Recommended Physical Activity Goals Kendra Saunders has been advised to work up to 300-450 minutes of moderate intensity aerobic activity a week and strengthening exercises 2-3 times per week for cardiovascular health, weight loss maintenance and preservation of muscle mass.   She has agreed to gradually increase exercise as able.    Pharmacotherapy See T2DM note.    ASSOCIATED CONDITIONS ADDRESSED TODAY:  Type 2 diabetes mellitus with other specified complication, without long-term current use of insulin  Kendra Saunders) Assessment & Plan: Most recent A1c of 6.0 dated 03/02/2024 with [facility].   HgbA1c is stable for age and comorbid conditions. Denies symptoms of hypoglycemia or hyperglycemia. On Metformin 500 mg daily and Mounjaro  7.5 mg wkly with good adherence and no side effects. Reports having increased sugar cravings.   Educated patient that having adequate amounts of protein with each meal is important for increasing muscle mass, stabilizing sugars, controlling cravings, and improving thermogenesis. Additionally, after extensive discussion of benefits & side effects, we will INCREASE her Mounjaro  to 10 mg wkly. Reminded about adequate hydration and importance of following her PNP.    Vitamin D  deficiency Assessment & Plan: Doing well on OTC Vitamin D  2,000 units daily. Most recent VD was 48 on 03/02/2024 with [facility]. Continue regimen and wt loss efforts. Recheck in 2-3 mos.    Follow up:   Return 05/24/2024 at 4:00 PM. She was informed of the importance of frequent follow up visits to  maximize her success with intensive lifestyle modifications for her multiple health conditions.  Subjective:   Chief complaint: Obesity Kendra Saunders is here to discuss her progress with her obesity treatment plan. She is on the Category 2 Plan and states she is following her eating plan approximately 50% of the time. She  states she is not exercising; pt had toe surgery and is healing well.   Interval History:  Kendra Saunders is here for a follow up office visit. Since last OV on 03/10/2024, pt is up 1 lb. Attributes her wt gain to eating off plan while vacationing in [place]. States she's back on track food wise. Is drinking 4 bottles of water daily. Her job has been more stressful as she recently got promoted.   Pharmacotherapy that aid with weight loss: She is taking Metformin 500 mg once daily and Mounjaro  7.5 mg wkly.   Review of Systems:  Pertinent positives were addressed with patient today.  Reviewed by clinician on day of visit: allergies, medications, problem list, medical history, surgical history, family history, social history, and previous encounter notes.  Weight Summary and Biometrics   Weight Lost Since Last Visit: 0  Weight Gained Since Last Visit: 1lb   Vitals Temp: 97.9 F (36.6 C) BP: 110/71 Pulse Rate: 79 SpO2: 99 %   Anthropometric Measurements Height: 5\' 9"  (1.753 m) Weight: 189 lb (85.7 kg) BMI (Calculated): 27.9 Weight at Last Visit: 188lb Weight Lost Since Last Visit: 0 Weight Gained Since Last Visit: 1lb Starting Weight: 208lb Total Weight Loss (lbs): 19 lb (8.618 kg) Peak Weight: 217lb   Body Composition  Body Fat %: 35.4 % Fat Mass (lbs): 67 lbs Muscle Mass (lbs): 115.8 lbs Total Body Water (lbs): 86 lbs Visceral Fat Rating : 8   Other Clinical Data Fasting: no Labs: no. Today's Visit #: 10 Starting Date: 07/08/23   Objective:   PHYSICAL EXAM: Blood pressure 110/71, pulse 79, temperature 97.9 F (36.6 C), height 5\' 9"  (1.753 m), weight 189 lb (85.7 kg), last menstrual period 05/08/2017, SpO2 99%. Body mass index is 27.91 kg/m.  General: she is overweight, cooperative and in no acute distress. PSYCH: Has normal mood, affect and thought process.   HEENT: EOMI, sclerae are anicteric. Lungs: Normal breathing effort, no conversational  dyspnea. Extremities: Moves * 4 Neurologic: A and O * 3, good insight  DIAGNOSTIC DATA REVIEWED: BMET    Component Value Date/Time   NA 140 07/08/2023 0857   K 4.7 07/08/2023 0857   CL 102 07/08/2023 0857   CO2 22 07/08/2023 0857   GLUCOSE 87 07/08/2023 0857   GLUCOSE 90 06/25/2016 1229   BUN 10 07/08/2023 0857   CREATININE 0.94 07/08/2023 0857   CREATININE 0.87 06/25/2016 1229   CALCIUM 9.9 07/08/2023 0857   GFRNONAA 81 07/01/2018 0850   GFRNONAA 77 10/19/2014 0954   GFRAA 93 07/01/2018 0850   GFRAA 89 10/19/2014 0954   Lab Results  Component Value Date   HGBA1C 6.3 (H) 07/08/2023   HGBA1C 5.7 (H) 10/19/2014   Lab Results  Component Value Date   INSULIN  18.6 07/08/2023   Lab Results  Component Value Date   TSH 1.960 12/29/2017   CBC    Component Value Date/Time   WBC 5.8 07/01/2018 0850   WBC 4.1 06/25/2016 1229   RBC 4.91 07/01/2018 0850   RBC 4.64 06/25/2016 1229   HGB 13.5 07/01/2018 0850   HCT 41.1 07/01/2018 0850   PLT 281 07/01/2018 0850   MCV 84 07/01/2018  0850   MCH 27.5 07/01/2018 0850   MCH 27.6 06/25/2016 1229   MCHC 32.8 07/01/2018 0850   MCHC 34.1 06/25/2016 1229   RDW 14.8 07/01/2018 0850   Iron Studies    Component Value Date/Time   IRON 118 06/13/2014 1121   TIBC 352 06/13/2014 1121   IRONPCTSAT 34 06/13/2014 1121   Lipid Panel     Component Value Date/Time   CHOL 138 07/08/2023 0857   TRIG 131 07/08/2023 0857   TRIG 108 12/01/2006 1058   HDL 36 (L) 07/08/2023 0857   CHOLHDL 6.5 (H) 07/01/2018 0919   CHOLHDL 5.0 10/19/2014 0954   VLDL 37 10/19/2014 0954   LDLCALC 79 07/08/2023 0857   Hepatic Function Panel     Component Value Date/Time   PROT 7.6 07/08/2023 0857   ALBUMIN 4.9 07/08/2023 0857   AST 18 07/08/2023 0857   ALT 20 07/08/2023 0857   ALKPHOS 53 07/08/2023 0857   BILITOT 0.4 07/08/2023 0857      Component Value Date/Time   TSH 1.960 12/29/2017 1814   Nutritional Lab Results  Component Value Date   VD25OH  58.2 07/08/2023   VD25OH 38.1 12/03/2016   VD25OH 28 (L) 06/25/2016    Attestations:   I, Special Puri , acting as a Stage manager for Marceil Sensor, DO., have compiled all relevant documentation for today's office visit on behalf of Marceil Sensor, DO, while in the presence of [employer], DO.  I have reviewed the above documentation for accuracy and completeness, and I agree with the above. Kendra Saunders, D.O.  The 21st Century Cures Act was signed into law in 2016 which includes the topic of electronic health records.  This provides immediate access to information in MyChart.  This includes consultation notes, operative notes, office notes, lab results and pathology reports.  If you have any questions about what you read please let us  know at your next visit so we can discuss your concerns and take corrective action if need be.  We are right here with you.

## 2024-04-24 ENCOUNTER — Other Ambulatory Visit (HOSPITAL_BASED_OUTPATIENT_CLINIC_OR_DEPARTMENT_OTHER): Payer: Self-pay

## 2024-04-26 ENCOUNTER — Other Ambulatory Visit (HOSPITAL_BASED_OUTPATIENT_CLINIC_OR_DEPARTMENT_OTHER): Payer: Self-pay

## 2024-04-27 ENCOUNTER — Telehealth (INDEPENDENT_AMBULATORY_CARE_PROVIDER_SITE_OTHER): Payer: Self-pay

## 2024-04-27 NOTE — Telephone Encounter (Signed)
 Pt Prior Auth is PENDING Kendra Saunders (Key: [id number]) Rx #: [id number] Mounjaro  10MG /0.5ML auto-injectors

## 2024-04-29 NOTE — Telephone Encounter (Signed)
 PA for Mounjaro  10MG  has been denied. PA is now complete.

## 2024-04-30 ENCOUNTER — Other Ambulatory Visit (HOSPITAL_BASED_OUTPATIENT_CLINIC_OR_DEPARTMENT_OTHER): Payer: Self-pay

## 2024-05-04 ENCOUNTER — Encounter: Payer: Self-pay | Admitting: Podiatry

## 2024-05-04 ENCOUNTER — Ambulatory Visit (INDEPENDENT_AMBULATORY_CARE_PROVIDER_SITE_OTHER)

## 2024-05-04 ENCOUNTER — Other Ambulatory Visit (HOSPITAL_BASED_OUTPATIENT_CLINIC_OR_DEPARTMENT_OTHER): Payer: Self-pay

## 2024-05-04 ENCOUNTER — Ambulatory Visit (INDEPENDENT_AMBULATORY_CARE_PROVIDER_SITE_OTHER): Admitting: Podiatry

## 2024-05-04 DIAGNOSIS — M898X7 Other specified disorders of bone, ankle and foot: Secondary | ICD-10-CM

## 2024-05-04 NOTE — Progress Notes (Signed)
  Subjective:  Patient ID: Kendra Saunders, female    DOB: 08-27-1966,  MRN: [id number]  Chief Complaint  Patient presents with   Routine Post Op    POV 3 DOS 03/30/24 RT 5TH EXOSTECTOMY "It's better."    DOS: 03/30/24  Procedure: Right fifth digit exostectomy  58 y.o. female returns for POV#3. Relates doing well and managing pain   Review of Systems: Negative except as noted in the HPI. Denies N/V/F/Ch.  Past Medical History:  Diagnosis Date   Anxiety    Asthma    childhood; last Albuterol use elementary school.   Depression    Diabetes mellitus without complication (HCC)    High cholesterol    Hypertension    Migraine    Mitral valve prolapse    Seasonal allergies    Sickle cell trait (HCC)     Current Outpatient Medications:    FLUoxetine  (PROZAC ) 20 MG tablet, TAKE 2 TABLETS(40 MG) BY MOUTH DAILY, Disp: 60 tablet, Rfl: 5   metFORMIN (GLUCOPHAGE) 500 MG tablet, Take 500 mg by mouth 2 (two) times daily with a meal., Disp: , Rfl:    olmesartan (BENICAR) 20 MG tablet, Take 20 mg by mouth daily., Disp: , Rfl:    ondansetron  (ZOFRAN ) 4 MG tablet, Take 1 tablet (4 mg total) by mouth every 8 (eight) hours as needed for nausea or vomiting., Disp: 20 tablet, Rfl: 0   rizatriptan  (MAXALT -MLT) 10 MG disintegrating tablet, Take 1 tablet (10 mg total) by mouth as needed for migraine. May repeat in 2 hours if needed, Disp: 8 tablet, Rfl: 5   rosuvastatin (CRESTOR) 10 MG tablet, Take 10 mg by mouth daily., Disp: , Rfl:    tirzepatide  (MOUNJARO ) 10 MG/0.5ML Pen, Inject 10 mg into the skin once a week., Disp: 2 mL, Rfl: 0  Current Facility-Administered Medications:    0.9 %  sodium chloride  infusion, 500 mL, Intravenous, Continuous, Nandigam, Kavitha V, MD  Social History   Tobacco Use  Smoking Status Never  Smokeless Tobacco Never    Allergies  Allergen Reactions   Peanuts [Peanut Oil]     Throat itches and she has stomach pains   Objective:  There were no vitals filed for  this visit. There is no height or weight on file to calculate BMI. Constitutional Well developed. Well nourished.  Vascular Foot warm and well perfused. Capillary refill normal to all digits.   Neurologic Normal speech. Oriented to person, place, and time. Epicritic sensation to light touch grossly present bilaterally.  Dermatologic Skin healing well without signs of infection. Skin edges well coapted without signs of infection.  Orthopedic: Tenderness to palpation noted about the surgical site.   Radiographs: Toe well aligned and additional spurring resected Assessment:   1. Exostosis of toe    Plan:  Patient was evaluated and treated and all questions answered.  S/p foot surgery right -Progressing as expected post-operatively. -WB Status: WBAT  in regular shoes   Return in 6 weeks for rehceck.   No follow-ups on file.

## 2024-05-24 ENCOUNTER — Ambulatory Visit (INDEPENDENT_AMBULATORY_CARE_PROVIDER_SITE_OTHER): Admitting: Family Medicine

## 2024-05-24 ENCOUNTER — Encounter (INDEPENDENT_AMBULATORY_CARE_PROVIDER_SITE_OTHER): Payer: Self-pay | Admitting: Family Medicine

## 2024-05-24 ENCOUNTER — Other Ambulatory Visit (HOSPITAL_BASED_OUTPATIENT_CLINIC_OR_DEPARTMENT_OTHER): Payer: Self-pay

## 2024-05-24 VITALS — BP 110/75 | HR 74 | Temp 98.4°F | Ht 69.0 in | Wt 183.0 lb

## 2024-05-24 DIAGNOSIS — E1169 Type 2 diabetes mellitus with other specified complication: Secondary | ICD-10-CM | POA: Diagnosis not present

## 2024-05-24 DIAGNOSIS — Z6827 Body mass index (BMI) 27.0-27.9, adult: Secondary | ICD-10-CM

## 2024-05-24 DIAGNOSIS — Z6831 Body mass index (BMI) 31.0-31.9, adult: Secondary | ICD-10-CM

## 2024-05-24 DIAGNOSIS — E66811 Obesity, class 1: Secondary | ICD-10-CM

## 2024-05-24 DIAGNOSIS — E559 Vitamin D deficiency, unspecified: Secondary | ICD-10-CM | POA: Diagnosis not present

## 2024-05-24 DIAGNOSIS — Z7985 Long-term (current) use of injectable non-insulin antidiabetic drugs: Secondary | ICD-10-CM

## 2024-05-24 DIAGNOSIS — Z7984 Long term (current) use of oral hypoglycemic drugs: Secondary | ICD-10-CM

## 2024-05-24 MED ORDER — TIRZEPATIDE 10 MG/0.5ML ~~LOC~~ SOAJ
10.0000 mg | SUBCUTANEOUS | 0 refills | Status: AC
Start: 2024-05-24 — End: ?
  Filled 2024-05-24 – 2024-06-04 (×2): qty 2, 28d supply, fill #0

## 2024-05-24 MED ORDER — METFORMIN HCL 500 MG PO TABS: 500.0000 mg | ORAL_TABLET | Freq: Two times a day (BID) | ORAL | Status: AC

## 2024-05-24 NOTE — Progress Notes (Signed)
 Barnie DOROTHA Jenkins, D.O.  ABFM, ABOM Specializing in Clinical Bariatric Medicine  Office located at: [address] [place], [place]  [address]   Assessment and Plan:  No orders of the defined types were placed in this encounter.   Medications Discontinued During This Encounter  Medication Reason   metFORMIN (GLUCOPHAGE) 500 MG tablet Reorder   tirzepatide  (MOUNJARO ) 10 MG/0.5ML Pen Reorder     Meds ordered this encounter  Medications   metFORMIN (GLUCOPHAGE) 500 MG tablet    Sig: Take 1 tablet (500 mg total) by mouth 2 (two) times daily with a meal.   tirzepatide  (MOUNJARO ) 10 MG/0.5ML Pen    Sig: Inject 10 mg into the skin once a week.    Dispense:  2 mL    Refill:  0      FOR THE DISEASE OF OBESITY: BMI 27.0-27.9,adult - Current BMI 27.01 Class 1 obesity with serious comorbidity and body mass index (BMI) of 30.0 to 30.9 in adult, unspecified obesity type Assessment & Plan: Since last office visit on 5/15 patient's muscle mass has increased by 6.4 lbs. Fat mass has decreased by 12.2 lbs. Total body water has decreased by 5 lbs.  Counseling done on how various foods will affect these numbers and how to maximize success  Total lbs lost to date: 25 lbs Total weight loss percentage to date: -12.02%    Recommended Dietary Goals Joellen is currently in the action stage of change. As such, her goal is to continue weight management plan.  She has agreed to: continue current plan   Behavioral Intervention We discussed the following today: increasing lean protein intake to established goals, increasing water intake , planning for success, and continue to work on maintaining a reduced calorie state, getting the recommended amount of protein, incorporating whole foods, making healthy choices, staying well hydrated and practicing mindfulness when eating.  Additional resources provided today: None  Evidence-based interventions for health behavior change were utilized today  including the discussion of self monitoring techniques, problem-solving barriers and SMART goal setting techniques.   Regarding patient's less desirable eating habits and patterns, we employed the technique of small changes.   Pt will specifically work on: Incorporating strength training to her exercise regimen for next visit.    Recommended Physical Activity Goals Bernedette has been advised to work up to 300-450 minutes of moderate intensity aerobic activity a week and strengthening exercises 2-3 times per week for cardiovascular health, weight loss maintenance and preservation of muscle mass.   She has agreed to: Increase physical activity in their day and reduce sedentary time (increase NEAT).and exercise  300-450 minutes a week of cardio and 30-60 minutes of strength training 2-3 days per week.  Pharmacotherapy We both agreed to: Continue with current nutritional and behavioral strategies and continue med regimen.   ASSOCIATED CONDITIONS ADDRESSED TODAY: Type 2 diabetes mellitus with other specified complication, without long-term current use of insulin  [facility]) Assessment & Plan: Lab Results  Component Value Date   HGBA1C 6.3 (H) 07/08/2023   HGBA1C 6.1 (H) 07/01/2018   HGBA1C 6.1 (H) 12/29/2017   INSULIN  18.6 07/08/2023    Reviewed most recent lab work drawn 2 months ago at [facility]; 2 months ago her A1c was 6 an d vit at the time was 48 serum creatinine was 1. Pt is on Metformin 500 mg BID and Mounjaro  10 mg once weekly. Tolerating overall well with no constipation or GI upset. She does note itchiness and redness around her injection  site on her upper thigh, only occurring with the 10 mg dose. She notes he tends to alternate between her thighs. Does not require refill of Metformin.  Continue with current regimen. Advised pt to inject Mounjaro  in abdomen to avoid a superficial reaction on her thighs. Continue to follow meal plan and regular exercise.   Orders: - Refill Mounjaro , no dose  changes - Refill Metformin   Vitamin D  deficiency Assessment & Plan: Lab Results  Component Value Date   VD25OH 58.2 07/08/2023   VD25OH 38.1 12/03/2016   VD25OH 28 (L) 06/25/2016   Patient is on vitamin D  supplementation. Tolerating well, no side effects. No concerns today. Reviewed ideal vitamin D  levels of 50-70. Continue supplementation today, no refills required today.   Follow up:   Return in about 1 month (around 06/23/2024) for 1 mo follow up on 7/16 and make another appt if pt desires. She was informed of the importance of frequent follow up visits to maximize her success with intensive lifestyle modifications for her multiple health conditions.  Subjective:   Chief complaint: Obesity Rufus is here to discuss her progress with her obesity treatment plan. She is on the Category 2 Plan and states she is following her eating plan approximately 50% of the time. She states she is walking or playing pickle ball for 60 minutes 3 days per week.  Interval History:  Essense Bousquet is here for a follow up office visit. Since last OV on 04/22/24, she lost 6 lbs. She reports some off plan eating at recent graduation celebrations. She tends to drink a protein shake when skipping meals. She has been purchasing a 5$ meal for one meals from [employer] that contains salmon and vegetables or sandwiches. Overall she has been focusing on portion controls. Pt unaware of exact weight of the salmon.    Pharmacotherapy for weight loss: She is currently taking Metformin with diabetes as primary indication with adequate clinical response  and without side effects. and Monjauro with diabetes as the primary indication with adequate clinical response  and without side effects..   Review of Systems:  Pertinent positives were addressed with patient today.  Reviewed by clinician on day of visit: allergies, medications, problem list, medical history, surgical history, family history, social history, and  previous encounter notes.  Weight Summary and Biometrics   Weight Lost Since Last Visit: 6lb  Weight Gained Since Last Visit: 0lb   Vitals Temp: 98.4 F (36.9 C) BP: 110/75 Pulse Rate: 74 SpO2: 100 %   Anthropometric Measurements Height: 5' 9 (1.753 m) Weight: 183 lb (83 kg) BMI (Calculated): 27.01 Weight at Last Visit: 189lb Weight Lost Since Last Visit: 6lb Weight Gained Since Last Visit: 0lb Starting Weight: 208lb Total Weight Loss (lbs): 25 lb (11.3 kg) Peak Weight: 217lb   Body Composition  Body Fat %: 29.9 % Fat Mass (lbs): 54.8 lbs Muscle Mass (lbs): 122.2 lbs Total Body Water (lbs): 81 lbs Visceral Fat Rating : 7   Other Clinical Data Fasting: No Labs: no Today's Visit #: 11 Starting Date: 07/08/23    Objective:   PHYSICAL EXAM: Blood pressure 110/75, pulse 74, temperature 98.4 F (36.9 C), height 5' 9 (1.753 m), weight 183 lb (83 kg), last menstrual period 05/08/2017, SpO2 100%. Body mass index is 27.02 kg/m.  General: she is overweight, cooperative and in no acute distress. PSYCH: Has normal mood, affect and thought process.   HEENT: EOMI, sclerae are anicteric. Lungs: Normal breathing effort, no conversational dyspnea. Extremities: Moves *  4 Neurologic: A and O * 3, good insight  DIAGNOSTIC DATA REVIEWED: BMET    Component Value Date/Time   NA 140 07/08/2023 0857   K 4.7 07/08/2023 0857   CL 102 07/08/2023 0857   CO2 22 07/08/2023 0857   GLUCOSE 87 07/08/2023 0857   GLUCOSE 90 06/25/2016 1229   BUN 10 07/08/2023 0857   CREATININE 0.94 07/08/2023 0857   CREATININE 0.87 06/25/2016 1229   CALCIUM 9.9 07/08/2023 0857   GFRNONAA 81 07/01/2018 0850   GFRNONAA 77 10/19/2014 0954   GFRAA 93 07/01/2018 0850   GFRAA 89 10/19/2014 0954   Lab Results  Component Value Date   HGBA1C 6.3 (H) 07/08/2023   HGBA1C 5.7 (H) 10/19/2014   Lab Results  Component Value Date   INSULIN  18.6 07/08/2023   Lab Results  Component Value Date    TSH 1.960 12/29/2017   CBC    Component Value Date/Time   WBC 5.8 07/01/2018 0850   WBC 4.1 06/25/2016 1229   RBC 4.91 07/01/2018 0850   RBC 4.64 06/25/2016 1229   HGB 13.5 07/01/2018 0850   HCT 41.1 07/01/2018 0850   PLT 281 07/01/2018 0850   MCV 84 07/01/2018 0850   MCH 27.5 07/01/2018 0850   MCH 27.6 06/25/2016 1229   MCHC 32.8 07/01/2018 0850   MCHC 34.1 06/25/2016 1229   RDW 14.8 07/01/2018 0850   Iron Studies    Component Value Date/Time   IRON 118 06/13/2014 1121   TIBC 352 06/13/2014 1121   IRONPCTSAT 34 06/13/2014 1121   Lipid Panel     Component Value Date/Time   CHOL 138 07/08/2023 0857   TRIG 131 07/08/2023 0857   TRIG 108 12/01/2006 1058   HDL 36 (L) 07/08/2023 0857   CHOLHDL 6.5 (H) 07/01/2018 0919   CHOLHDL 5.0 10/19/2014 0954   VLDL 37 10/19/2014 0954   LDLCALC 79 07/08/2023 0857   Hepatic Function Panel     Component Value Date/Time   PROT 7.6 07/08/2023 0857   ALBUMIN 4.9 07/08/2023 0857   AST 18 07/08/2023 0857   ALT 20 07/08/2023 0857   ALKPHOS 53 07/08/2023 0857   BILITOT 0.4 07/08/2023 0857      Component Value Date/Time   TSH 1.960 12/29/2017 1814   Nutritional Lab Results  Component Value Date   VD25OH 58.2 07/08/2023   VD25OH 38.1 12/03/2016   VD25OH 28 (L) 06/25/2016    Attestations:   LILLETTE Vernell Forest, acting as a medical scribe for Barnie Jenkins, DO., have compiled all relevant documentation for today's office visit on behalf of Barnie Jenkins, DO, while in the presence of [employer], DO.  Reviewed by clinician on day of visit: allergies, medications, problem list, medical history, surgical history, family history, social history, and previous encounter notes pertinent to patient's obesity diagnosis.  I have reviewed the above documentation for accuracy and completeness, and I agree with the above. Barnie JINNY Jenkins, D.O.  The 21st Century Cures Act was signed into law in 2016 which includes the topic of  electronic health records.  This provides immediate access to information in MyChart.  This includes consultation notes, operative notes, office notes, lab results and pathology reports.  If you have any questions about what you read please let us  know at your next visit so we can discuss your concerns and take corrective action if need be.  We are right here with you.

## 2024-05-25 ENCOUNTER — Other Ambulatory Visit: Payer: Self-pay

## 2024-06-04 ENCOUNTER — Other Ambulatory Visit (HOSPITAL_COMMUNITY): Payer: Self-pay

## 2024-06-04 ENCOUNTER — Other Ambulatory Visit (HOSPITAL_BASED_OUTPATIENT_CLINIC_OR_DEPARTMENT_OTHER): Payer: Self-pay

## 2024-06-07 ENCOUNTER — Telehealth (INDEPENDENT_AMBULATORY_CARE_PROVIDER_SITE_OTHER): Payer: Self-pay

## 2024-06-07 ENCOUNTER — Other Ambulatory Visit (HOSPITAL_BASED_OUTPATIENT_CLINIC_OR_DEPARTMENT_OTHER): Payer: Self-pay

## 2024-06-07 ENCOUNTER — Encounter (INDEPENDENT_AMBULATORY_CARE_PROVIDER_SITE_OTHER): Payer: Self-pay | Admitting: Family Medicine

## 2024-06-07 NOTE — Telephone Encounter (Signed)
(  Key: [id number]) Rx #: [id number] Mounjaro  10MG /0.5ML auto-injectors  Wait for Determination Please wait for [employer] NCPDP 2017 to return a determination.

## 2024-06-09 ENCOUNTER — Other Ambulatory Visit (HOSPITAL_BASED_OUTPATIENT_CLINIC_OR_DEPARTMENT_OTHER): Payer: Self-pay

## 2024-06-10 ENCOUNTER — Other Ambulatory Visit (HOSPITAL_BASED_OUTPATIENT_CLINIC_OR_DEPARTMENT_OTHER): Payer: Self-pay

## 2024-06-10 NOTE — Telephone Encounter (Signed)
 PA for Mounjaro  10MG  has been denied. PA is now complete.      Message from Plan Your PA request has been denied. Additional information will be provided in the denial communication. (Message [phone])

## 2024-06-10 NOTE — Telephone Encounter (Signed)
 PA questions for Mounjaro  10MG   have been answered and all documentation has been included. Waiting on a determination.

## 2024-06-16 ENCOUNTER — Other Ambulatory Visit (HOSPITAL_BASED_OUTPATIENT_CLINIC_OR_DEPARTMENT_OTHER): Payer: Self-pay

## 2024-06-18 ENCOUNTER — Other Ambulatory Visit (HOSPITAL_BASED_OUTPATIENT_CLINIC_OR_DEPARTMENT_OTHER): Payer: Self-pay

## 2024-06-18 ENCOUNTER — Encounter: Admitting: Podiatry

## 2024-06-23 ENCOUNTER — Ambulatory Visit (INDEPENDENT_AMBULATORY_CARE_PROVIDER_SITE_OTHER): Admitting: Family Medicine

## 2024-06-23 ENCOUNTER — Encounter (INDEPENDENT_AMBULATORY_CARE_PROVIDER_SITE_OTHER): Payer: Self-pay

## 2024-06-24 ENCOUNTER — Telehealth: Payer: Self-pay | Admitting: *Deleted

## 2024-06-24 NOTE — Telephone Encounter (Signed)
 I called the patient and left a message asking her to come in at 7:30 am for her appointment tomorrow morning because we need xrays of her foot.

## 2024-06-25 ENCOUNTER — Encounter: Payer: Self-pay | Admitting: Podiatry

## 2024-06-25 ENCOUNTER — Ambulatory Visit: Payer: Self-pay

## 2024-06-25 ENCOUNTER — Ambulatory Visit (INDEPENDENT_AMBULATORY_CARE_PROVIDER_SITE_OTHER): Admitting: Podiatry

## 2024-06-25 DIAGNOSIS — M898X7 Other specified disorders of bone, ankle and foot: Secondary | ICD-10-CM

## 2024-06-25 DIAGNOSIS — Z9889 Other specified postprocedural states: Secondary | ICD-10-CM

## 2024-06-25 NOTE — Progress Notes (Signed)
  Subjective:  Patient ID: Kendra Saunders, female    DOB: [birth year],  MRN: [id number]  Chief Complaint  Patient presents with   Routine Post Op    POST OP - POV 4 DOS 03/30/24 RT 5TH EXOSTECTOMY It's still swelling but it's better than what it was, so I am not complaining.    DOS: 03/30/24  Procedure: Right fifth digit exostectomy  58 y.o. female returns for POV#3. Relates doing well still has some swelling but doing much better than prior to surgery.   Review of Systems: Negative except as noted in the HPI. Denies N/V/F/Ch.  Past Medical History:  Diagnosis Date   Anxiety    Asthma    childhood; last Albuterol use elementary school.   Depression    Diabetes mellitus without complication (HCC)    High cholesterol    Hypertension    Migraine    Mitral valve prolapse    Seasonal allergies    Sickle cell trait (HCC)     Current Outpatient Medications:    FLUoxetine  (PROZAC ) 20 MG tablet, TAKE 2 TABLETS(40 MG) BY MOUTH DAILY, Disp: 60 tablet, Rfl: 5   metFORMIN  (GLUCOPHAGE ) 500 MG tablet, Take 1 tablet (500 mg total) by mouth 2 (two) times daily with a meal., Disp: , Rfl:    olmesartan (BENICAR) 20 MG tablet, Take 20 mg by mouth daily., Disp: , Rfl:    ondansetron  (ZOFRAN ) 4 MG tablet, Take 1 tablet (4 mg total) by mouth every 8 (eight) hours as needed for nausea or vomiting., Disp: 20 tablet, Rfl: 0   rizatriptan  (MAXALT -MLT) 10 MG disintegrating tablet, Take 1 tablet (10 mg total) by mouth as needed for migraine. May repeat in 2 hours if needed, Disp: 8 tablet, Rfl: 5   rosuvastatin (CRESTOR) 10 MG tablet, Take 10 mg by mouth daily., Disp: , Rfl:    tirzepatide  (MOUNJARO ) 10 MG/0.5ML Pen, Inject 10 mg into the skin once a week., Disp: 2 mL, Rfl: 0  Current Facility-Administered Medications:    0.9 %  sodium chloride  infusion, 500 mL, Intravenous, Continuous, Nandigam, Kavitha V, MD  Social History   Tobacco Use  Smoking Status Never  Smokeless Tobacco Never     Allergies  Allergen Reactions   Peanuts [Peanut Oil]     Throat itches and she has stomach pains   Objective:  There were no vitals filed for this visit. There is no height or weight on file to calculate BMI. Constitutional Well developed. Well nourished.  Vascular Foot warm and well perfused. Capillary refill normal to all digits.   Neurologic Normal speech. Oriented to person, place, and time. Epicritic sensation to light touch grossly present bilaterally.  Dermatologic Skin healing well without signs of infection. Skin edges well coapted without signs of infection.  Orthopedic: Tenderness to palpation noted about the surgical site.   Radiographs: Toe well aligned and additional spurring resected Assessment:   1. Exostosis of toe    Plan:  Patient was evaluated and treated and all questions answered.  S/p foot surgery right -Progressing as expected post-operatively. -WB Status: WBAT  in regular shoes   Patient discharged from surgical standpoint.   No follow-ups on file.

## 2024-06-26 ENCOUNTER — Other Ambulatory Visit (HOSPITAL_BASED_OUTPATIENT_CLINIC_OR_DEPARTMENT_OTHER): Payer: Self-pay

## 2024-07-02 ENCOUNTER — Other Ambulatory Visit (HOSPITAL_BASED_OUTPATIENT_CLINIC_OR_DEPARTMENT_OTHER): Payer: Self-pay

## 2024-07-16 ENCOUNTER — Other Ambulatory Visit (HOSPITAL_BASED_OUTPATIENT_CLINIC_OR_DEPARTMENT_OTHER): Payer: Self-pay

## 2024-07-19 ENCOUNTER — Other Ambulatory Visit (HOSPITAL_BASED_OUTPATIENT_CLINIC_OR_DEPARTMENT_OTHER): Payer: Self-pay

## 2024-07-22 ENCOUNTER — Ambulatory Visit (INDEPENDENT_AMBULATORY_CARE_PROVIDER_SITE_OTHER): Admitting: Family Medicine

## 2024-09-22 ENCOUNTER — Encounter (INDEPENDENT_AMBULATORY_CARE_PROVIDER_SITE_OTHER): Payer: Self-pay
